# Patient Record
Sex: Female | Born: 1990 | Race: White | Hispanic: No | Marital: Single | Smoking: Former smoker
Health system: Southern US, Community
[De-identification: ages and names within clinical notes are randomized; demographics above are authoritative.]

## PROBLEM LIST (undated history)

## (undated) DIAGNOSIS — F1910 Other psychoactive substance abuse, uncomplicated: Secondary | ICD-10-CM

## (undated) DIAGNOSIS — R87619 Unspecified abnormal cytological findings in specimens from cervix uteri: Secondary | ICD-10-CM

## (undated) DIAGNOSIS — R51 Headache: Secondary | ICD-10-CM

## (undated) DIAGNOSIS — N912 Amenorrhea, unspecified: Secondary | ICD-10-CM

## (undated) DIAGNOSIS — S52601A Unspecified fracture of lower end of right ulna, initial encounter for closed fracture: Secondary | ICD-10-CM

## (undated) HISTORY — DX: Amenorrhea, unspecified: N91.2

## (undated) HISTORY — DX: Other psychoactive substance abuse, uncomplicated: F19.10

## (undated) HISTORY — PX: LEEP: SHX91

## (undated) HISTORY — PX: GUM SURGERY: SHX658

## (undated) HISTORY — DX: Unspecified abnormal cytological findings in specimens from cervix uteri: R87.619

## (undated) HISTORY — PX: COLPOSCOPY: SHX161

## (undated) HISTORY — DX: Headache: R51

## (undated) HISTORY — DX: Unspecified fracture of lower end of right ulna, initial encounter for closed fracture: S52.601A

---
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
## 2012-04-26 ENCOUNTER — Encounter: Payer: Self-pay | Admitting: Certified Nurse Midwife

## 2012-06-08 ENCOUNTER — Ambulatory Visit: Payer: Self-pay | Admitting: Certified Nurse Midwife

## 2012-06-08 ENCOUNTER — Encounter: Payer: Self-pay | Admitting: Certified Nurse Midwife

## 2012-06-08 DIAGNOSIS — Z01419 Encounter for gynecological examination (general) (routine) without abnormal findings: Secondary | ICD-10-CM

## 2012-06-09 ENCOUNTER — Telehealth: Payer: Self-pay

## 2012-06-09 NOTE — Telephone Encounter (Signed)
Left message for patient to call office. Pt needs to schedule aex for exam as well as thyroid f/u per deborah leonard,cnm

## 2012-06-15 NOTE — Telephone Encounter (Signed)
Left message for patient to schedule appt

## 2012-06-21 NOTE — Telephone Encounter (Signed)
Left message for call back.

## 2012-06-29 NOTE — Telephone Encounter (Signed)
Pt made appt for aex for 07-08-12

## 2012-07-08 ENCOUNTER — Ambulatory Visit (INDEPENDENT_AMBULATORY_CARE_PROVIDER_SITE_OTHER): Payer: BC Managed Care – PPO | Admitting: Gynecology

## 2012-07-08 ENCOUNTER — Encounter: Payer: Self-pay | Admitting: Certified Nurse Midwife

## 2012-07-08 VITALS — BP 104/68 | Ht 62.75 in | Wt 115.0 lb

## 2012-07-08 DIAGNOSIS — Z113 Encounter for screening for infections with a predominantly sexual mode of transmission: Secondary | ICD-10-CM

## 2012-07-08 DIAGNOSIS — F1911 Other psychoactive substance abuse, in remission: Secondary | ICD-10-CM

## 2012-07-08 DIAGNOSIS — A749 Chlamydial infection, unspecified: Secondary | ICD-10-CM

## 2012-07-08 DIAGNOSIS — F1910 Other psychoactive substance abuse, uncomplicated: Secondary | ICD-10-CM

## 2012-07-08 DIAGNOSIS — N912 Amenorrhea, unspecified: Secondary | ICD-10-CM

## 2012-07-08 DIAGNOSIS — Z01419 Encounter for gynecological examination (general) (routine) without abnormal findings: Secondary | ICD-10-CM

## 2012-07-08 NOTE — Progress Notes (Signed)
22 y.o.  Single  Caucasian female   No obstetric history on file. here for annual exam.  Pt contracepting with condoms only, no menses in April, inconsistant condom use, interested in birth control.  1st coitus at 37, 5 partners lifetime. No dyspareunia.   Patient's last menstrual period was 05/27/2012.          Sexually active: yes  The current method of family planning is condoms most of the time.    Exercising: none Last mammogram: none   Last pap smear: none History of abnormal pap: none  Smoking: 4 a day Alcohol: none Last colonoscopy: none Last Bone Density: none  Last tetanus shot: Last cholesterol check: none UPT: neg Self breast exams: occ Hgb:not done                Urine: not done    Health Maintenance  Topic Date Due  . Chlamydia Screening  03/03/2008  . Pap Smear  03/04/2011  . Tetanus/tdap  03/03/2012  . Influenza Vaccine  11/08/2012    Family History  Problem Relation Age of Onset  . Diabetes Paternal Grandmother     There are no active problems to display for this patient.   Past Medical History  Diagnosis Date  . Substance abuse     previous pain pill addiction  . Headache     Past Surgical History  Procedure Laterality Date  . Gum surgery      age 8    Allergies: Zithromax  Current Outpatient Prescriptions  Medication Sig Dispense Refill  . Ibuprofen (ADVIL PO) Take by mouth as needed.       No current facility-administered medications for this visit.    ROS: Pertinent items are noted in HPI.  Social Hx:    Exam:    BP 104/68  Ht 5' 2.75" (1.594 m)  Wt 115 lb (52.164 kg)  BMI 20.53 kg/m2  LMP 05/27/2012   Wt Readings from Last 3 Encounters:  07/08/12 115 lb (52.164 kg) (25%*, Z = -0.67)   * Growth percentiles are based on CDC 2-20 Years data.     Ht Readings from Last 3 Encounters:  07/08/12 5' 2.75" (1.594 m) (27%*, Z = -0.60)   * Growth percentiles are based on CDC 2-20 Years data.    General appearance: alert,  cooperative and appears stated age Head: Normocephalic, without obvious abnormality, atraumatic Neck: no adenopathy, supple, symmetrical, trachea midline and thyroid not enlarged, symmetric, no tenderness/mass/nodules Lungs: clear to auscultation bilaterally Breasts: Inspection negative, No nipple retraction or dimpling, No nipple discharge or bleeding, No axillary or supraclavicular adenopathy, Normal to palpation without dominant masses Heart: regular rate and rhythm Abdomen: soft, non-tender; bowel sounds normal; no masses,  no organomegaly Extremities: extremities normal, atraumatic, no cyanosis or edema Skin: Skin color, texture, turgor normal. No rashes or lesions Lymph nodes: Cervical, supraclavicular, and axillary nodes normal. No abnormal inguinal nodes palpated Neurologic: Grossly normal   Pelvic: External genitalia:  no lesions              Urethra:  normal appearing urethra with no masses, tenderness or lesions              Bartholins and Skenes: normal                 Vagina: normal appearing vagina with normal color and discharge, no lesions              Cervix: normal appearance  Pap taken: no        Bimanual Exam:  Uterus:  uterus is normal size, shape, consistency and nontender                                      Adnexa: normal adnexa in size, nontender and no masses                                      Rectovaginal: Confirms                                      Anus:  normal sphincter tone, no lesions  A: normal gyn exam     P: pap smear at 23, guideline reviewed STD prevention discussed Contraceptive options discussed-preferred nonoral options- info on SKYLA &NEXPLANON given, instructed to call with menses return annually or prn     An After Visit Summary was printed and given to the patient.

## 2012-07-08 NOTE — Patient Instructions (Addendum)
Contraceptive Implant Information A contraceptive implant is a plastic rod that is inserted under the skin. It is usually inserted under the skin of your upper arm. It continually releases small amounts of progestin (synthetic progesterone) into the bloodstream. This prevents an egg from being released from the ovary. It also thickens the cervical mucus to prevent sperm from entering the cervix, and it thins the uterine lining to prevent a fertilized egg from attaching to the uterus. They can be effective for up to 3 years. Implants do not provide protection against sexually transmitted diseases (STDs).  The procedure to insert an implant usually takes about 10 minutes. There may be minor bruising, swelling, and discomfort at the insertion site for a couple days. The implant begins to work within the first day. Other contraceptive protection should be used for 2 weeks. Follow up with your caregiver to get rechecked as directed. Your caregiver will make sure you are a good candidate for the contraceptive implant. Discuss with your caregiver the possible side effects of the implant ADVANTAGES  It prevents pregnancy for up to 3 years.  It is easily reversible.  It is convenient.  The progestins may protect against uterine and ovarian cancer.  It can be used when breastfeeding.  It can be used by women who cannot take estrogen. DISADVANTAGES  You may have irregular or unplanned vaginal bleeding.  You may develop side effects, including headache, weight gain, acne, breast tenderness, or mood changes.  You may have tissue or nerve damage after insertion (rare).  It may be difficult and uncomfortable to remove.  Certain medications may interfere with the effectiveness of the implants. REMOVAL OF IMPLANT The implant should be removed in 3 years or as directed by your caregiver. The implants effect wears off in a few hours after removal. Your ability to get pregnant (fertility) is restored  within a couple of weeks. New implants can be inserted as soon as the old ones are removed if desired. DO NOT GET THE IMPLANT IF:   You are pregnant.  You have a history of breast cancer, osteoporosis, blood clots, heart disease, diabetes, high blood pressure, liver disease, tumors, or stroke.   You have undiagnosed vaginal bleeding.  You have overly sensitive to certain parts of the implant. Document Released: 02/13/2011 Document Revised: 05/19/2011 Document Reviewed: 02/13/2011 [facility] Patient Information 2013 [place], [place].  Levonorgestrel intrauterine device (IUD) What is this medicine? LEVONORGESTREL IUD (LEE voe nor jes trel) is a contraceptive (birth control) device. It is used to prevent pregnancy and to treat heavy bleeding that occurs during your period. It can be used for up to 5 years. This medicine may be used for other purposes; ask your health care provider or pharmacist if you have questions. What should I tell my health care provider before I take this medicine? They need to know if you have any of these conditions: -abnormal Pap smear -cancer of the breast, uterus, or cervix -diabetes -endometritis -genital or pelvic infection now or in the past -have more than one sexual partner or your partner has more than one partner -heart disease -history of an ectopic or tubal pregnancy -immune system problems -IUD in place -liver disease or tumor -problems with blood clots or take blood-thinners -use intravenous drugs -uterus of unusual shape -vaginal bleeding that has not been explained -an unusual or allergic reaction to levonorgestrel, other hormones, silicone, or polyethylene, medicines, foods, dyes, or preservatives -pregnant or trying to get pregnant -breast-feeding How should I use this medicine?  This device is placed inside the uterus by a health care professional. Talk to your pediatrician regarding the use of this medicine in children. Special care may be  needed. Overdosage: If you think you have taken too much of this medicine contact a poison control center or emergency room at once. NOTE: This medicine is only for you. Do not share this medicine with others. What if I miss a dose? This does not apply. What may interact with this medicine? Do not take this medicine with any of the following medications: -amprenavir -bosentan -fosamprenavir This medicine may also interact with the following medications: -aprepitant -barbiturate medicines for inducing sleep or treating seizures -bexarotene -griseofulvin -medicines to treat seizures like carbamazepine, ethotoin, felbamate, oxcarbazepine, phenytoin, topiramate -modafinil -pioglitazone -rifabutin -rifampin -rifapentine -some medicines to treat HIV infection like atazanavir, indinavir, lopinavir, nelfinavir, tipranavir, ritonavir -St. John's wort -warfarin This list may not describe all possible interactions. Give your health care provider a list of all the medicines, herbs, non-prescription drugs, or dietary supplements you use. Also tell them if you smoke, drink alcohol, or use illegal drugs. Some items may interact with your medicine. What should I watch for while using this medicine? Visit your doctor or health care professional for regular check ups. See your doctor if you or your partner has sexual contact with others, becomes HIV positive, or gets a sexual transmitted disease. This product does not protect you against HIV infection (AIDS) or other sexually transmitted diseases. You can check the placement of the IUD yourself by reaching up to the top of your vagina with clean fingers to feel the threads. Do not pull on the threads. It is a good habit to check placement after each menstrual period. Call your doctor right away if you feel more of the IUD than just the threads or if you cannot feel the threads at all. The IUD may come out by itself. You may become pregnant if the device  comes out. If you notice that the IUD has come out use a backup birth control method like condoms and call your health care provider. Using tampons will not change the position of the IUD and are okay to use during your period. What side effects may I notice from receiving this medicine? Side effects that you should report to your doctor or health care professional as soon as possible: -allergic reactions like skin rash, itching or hives, swelling of the face, lips, or tongue -fever, flu-like symptoms -genital sores -high blood pressure -no menstrual period for 6 weeks during use -pain, swelling, warmth in the leg -pelvic pain or tenderness -severe or sudden headache -signs of pregnancy -stomach cramping -sudden shortness of breath -trouble with balance, talking, or walking -unusual vaginal bleeding, discharge -yellowing of the eyes or skin Side effects that usually do not require medical attention (report to your doctor or health care professional if they continue or are bothersome): -acne -breast pain -change in sex drive or performance -changes in weight -cramping, dizziness, or faintness while the device is being inserted -headache -irregular menstrual bleeding within first 3 to 6 months of use -nausea This list may not describe all possible side effects. Call your doctor for medical advice about side effects. You may report side effects to FDA at 1-800-FDA-1088. Where should I keep my medicine? This does not apply. NOTE: This sheet is a summary. It may not cover all possible information. If you have questions about this medicine, talk to your doctor, pharmacist, or health care provider.  2012, Elsevier/Gold Standard. (03/17/2008 6:39:08 PM)

## 2012-07-12 ENCOUNTER — Other Ambulatory Visit: Payer: Self-pay | Admitting: *Deleted

## 2012-07-12 MED ORDER — DOXYCYCLINE HYCLATE 100 MG PO CAPS: 100.0000 mg | ORAL_CAPSULE | Freq: Two times a day (BID) | ORAL | Status: DC

## 2012-07-12 NOTE — Addendum Note (Signed)
Addended by: Douglass Rivers on: 07/12/2012 10:32 AM   Modules accepted: Orders

## 2012-07-12 NOTE — Addendum Note (Signed)
Addended by: Douglass Rivers on: 07/12/2012 10:38 AM   Modules accepted: Orders

## 2012-07-12 NOTE — Telephone Encounter (Signed)
Patient notified of medication sent to [facility] and need to follow up in 3 month with Dr. Farrel Gobble.

## 2012-07-13 ENCOUNTER — Telehealth: Payer: Self-pay | Admitting: Gynecology

## 2012-07-13 NOTE — Telephone Encounter (Signed)
Patient took doxycycline (for Chlamydia) and cold medicine at the same time and reports she just threw up four time. Patient wants to speak with nurse please.

## 2012-07-13 NOTE — Telephone Encounter (Signed)
Spoke with pharmacist about replacing the doxycycline pt vomited this morning. Pt can buy one out of pocket for $12, or wait until towards the end of the 7 days of treatment and it can be run through her insurance for a cheaper price.

## 2012-07-13 NOTE — Telephone Encounter (Signed)
Spoke with pt about replacing pill. Instructed pt she can wait 5-6 days to pick up so that the pharmacy can go through her insurance, or she can pay $12 out of pocket and get it anytime. Pt verbalized understanding.

## 2012-07-13 NOTE — Telephone Encounter (Signed)
Spoke with pt who took her first doxycycline pill with cold medicine this morning. Pt started feeling sick and vomited. Instructed pt to take the doxycycline by itself with a meal next time to make it easier on her stomach. Instructed pt I would contact pharmacy to replace the vomited pill as she needs to take the full course of treatment. Pt agreeable.

## 2012-07-26 ENCOUNTER — Telehealth: Payer: Self-pay | Admitting: Gynecology

## 2012-07-26 NOTE — Telephone Encounter (Signed)
[facility] notified of need to dispense one capsule of doxycycline for complete patient 14 day regimen . Patient was sick the first day of taking medication and did not keep down the first capsule.

## 2012-07-26 NOTE — Telephone Encounter (Signed)
Routed to triage. See notes

## 2012-07-26 NOTE — Telephone Encounter (Signed)
Patient was states one tablet of doxycycline hyclate was to be called in and pharmacy does not have it. [facility] [address]

## 2012-07-27 ENCOUNTER — Telehealth: Payer: Self-pay | Admitting: Gynecology

## 2012-07-27 NOTE — Telephone Encounter (Signed)
pateint states she took doxycycline this morning and has been throwing up all day. Can not keep anything down. Asking does she need another pill called in. See phone note for 07/13/2012, patient also asking if she may have work excuse for today because she has been sick today. Please advise. sue

## 2012-07-27 NOTE — Telephone Encounter (Signed)
Patient called because she is continuously vomiting after having taken medication.

## 2012-07-27 NOTE — Telephone Encounter (Signed)
Patient regurgitated last pill and needs refill of that last pill.

## 2012-08-18 ENCOUNTER — Ambulatory Visit (INDEPENDENT_AMBULATORY_CARE_PROVIDER_SITE_OTHER): Payer: BC Managed Care – PPO | Admitting: Gynecology

## 2012-08-18 ENCOUNTER — Encounter: Payer: Self-pay | Admitting: Gynecology

## 2012-08-18 VITALS — BP 100/60 | Wt 116.0 lb

## 2012-08-18 DIAGNOSIS — Z8619 Personal history of other infectious and parasitic diseases: Secondary | ICD-10-CM | POA: Insufficient documentation

## 2012-08-18 DIAGNOSIS — Z202 Contact with and (suspected) exposure to infections with a predominantly sexual mode of transmission: Secondary | ICD-10-CM

## 2012-08-18 DIAGNOSIS — Z3040 Encounter for surveillance of contraceptives, unspecified: Secondary | ICD-10-CM

## 2012-08-18 DIAGNOSIS — N912 Amenorrhea, unspecified: Secondary | ICD-10-CM

## 2012-08-18 DIAGNOSIS — Z309 Encounter for contraceptive management, unspecified: Secondary | ICD-10-CM

## 2012-08-18 MED ORDER — MEDROXYPROGESTERONE ACETATE 150 MG/ML IM SUSP: 150.0000 mg | INTRAMUSCULAR | Status: DC

## 2012-08-18 MED ORDER — MEDROXYPROGESTERONE ACETATE 150 MG/ML IM SUSP
150.0000 mg | Freq: Once | INTRAMUSCULAR | Status: AC
  Administered 2012-08-18: 150 mg via INTRAMUSCULAR

## 2012-08-18 NOTE — Progress Notes (Signed)
Subjective:     Patient ID: Maria Chang, female   DOB: [birth year], 22 y.o.   MRN: [id number]  HPI Comments: Pt here for contraceptive management and requests TOC for +CTM.  Pt reports LMP 05/27/12, neg UPT here today, she has not been sexually active since +CTM diagnosis and reports taking doxycyline as directed.  Pt reports boyfriend has also been treated.  Pt reports long history of missed menses in past, can skip 4-51m at a time.  Pt is interested in trying DepoProvera, we had discussed other options in the past.    Review of Systems Per HPI    Objective:   Physical Exam  Constitutional: She appears well-developed and well-nourished.  Neurological: She is alert.  Pelvic: cervix normal in appearance, external genitalia normal, no adnexal masses or tenderness, no cervical motion tenderness, uterus normal size, shape, and consistency and vagina normal without discharge      Assessment:     Contraceptive management H/o CTM Amenorrhea-neg upt    Plan:     Discussed pros and cons of DepoProvera- aware she may ave irregular bleeding and then no cycle which will be protective against uterine cancer Discussed not ideal timing for test of cure but will run urine still repeat in 72m

## 2012-08-18 NOTE — Patient Instructions (Addendum)

## 2012-08-19 LAB — GC/CHLAMYDIA PROBE AMP, URINE
Chlamydia, Swab/Urine, PCR: NEGATIVE
GC Probe Amp, Urine: NEGATIVE

## 2012-10-13 ENCOUNTER — Ambulatory Visit: Payer: BC Managed Care – PPO | Admitting: Gynecology

## 2012-11-03 ENCOUNTER — Other Ambulatory Visit: Payer: BC Managed Care – PPO

## 2012-11-03 ENCOUNTER — Ambulatory Visit (INDEPENDENT_AMBULATORY_CARE_PROVIDER_SITE_OTHER): Payer: BC Managed Care – PPO | Admitting: *Deleted

## 2012-11-03 VITALS — BP 114/60 | HR 60 | Resp 12 | Wt 120.0 lb

## 2012-11-03 DIAGNOSIS — Z3040 Encounter for surveillance of contraceptives, unspecified: Secondary | ICD-10-CM

## 2012-11-03 MED ORDER — MEDROXYPROGESTERONE ACETATE 150 MG/ML IM SUSP
150.0000 mg | Freq: Once | INTRAMUSCULAR | Status: AC
  Administered 2012-11-03: 150 mg via INTRAMUSCULAR

## 2012-11-03 NOTE — Progress Notes (Signed)
Patient tolerated injection well is aware to come in for next Depo in 3 months.

## 2012-12-10 IMAGING — US US SOFT TISSUE HEAD/NECK
1 series · 14 of 25 positions shown · non-contrast
Comparison: None

CLINICAL DATA: Thyromegaly

THYROID ULTRASOUND
TECHNIQUE: Ultrasound examination of the thyroid gland and adjacent
soft tissues was performed.

[Series 1: us soft tissue head/neck · 0.09mm/px · 14 of 45 slices shown]
[im 1/45]
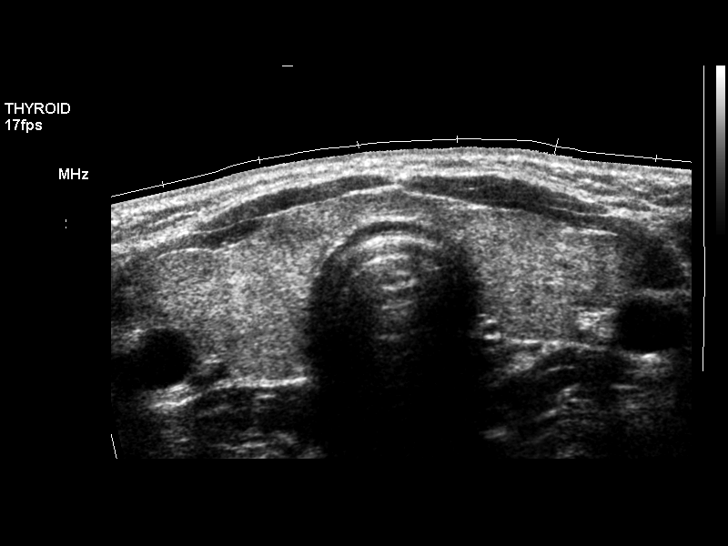
[im 4/45]
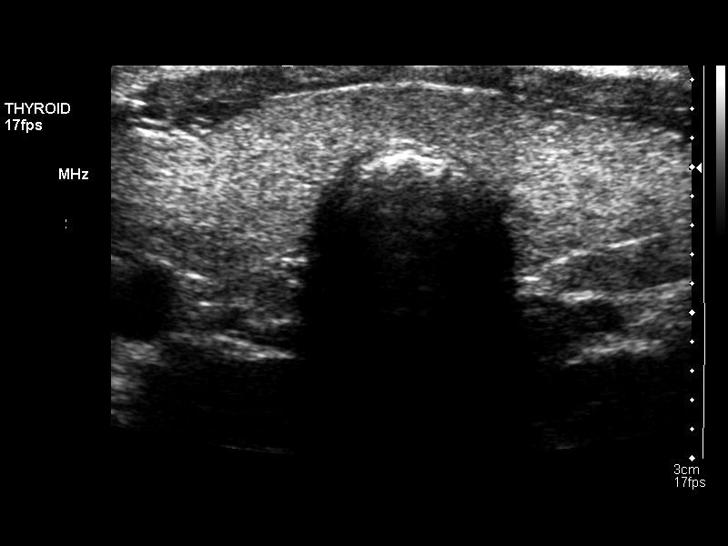
[im 8/45]
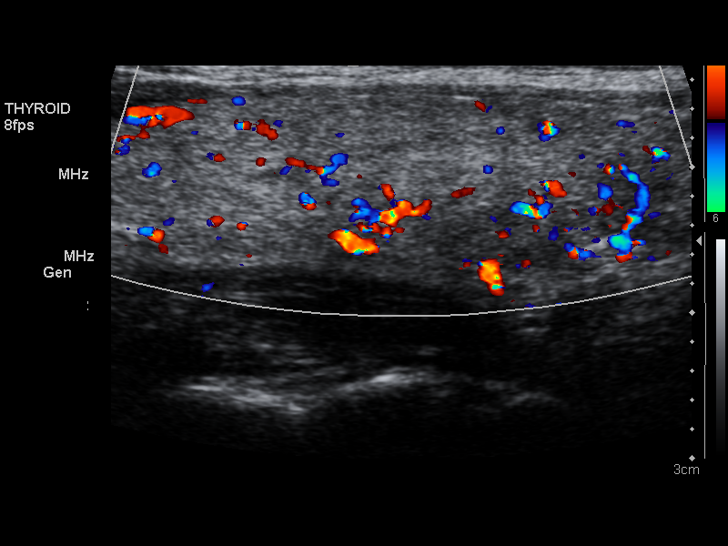
[im 12/45]
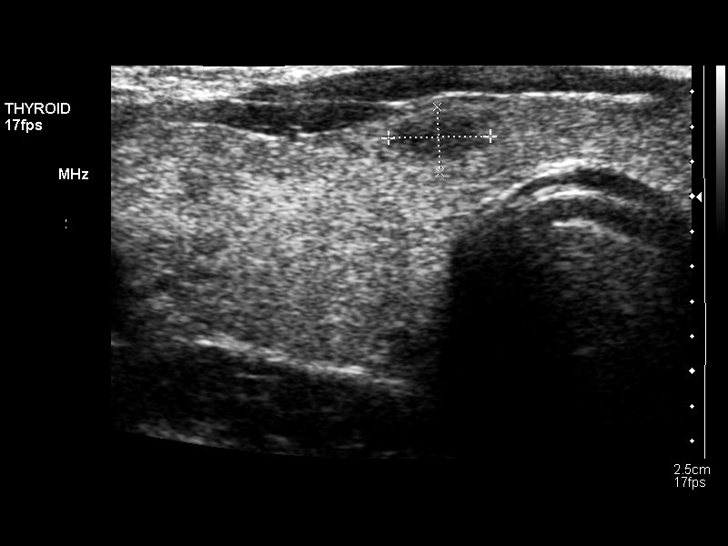
[im 15/45]
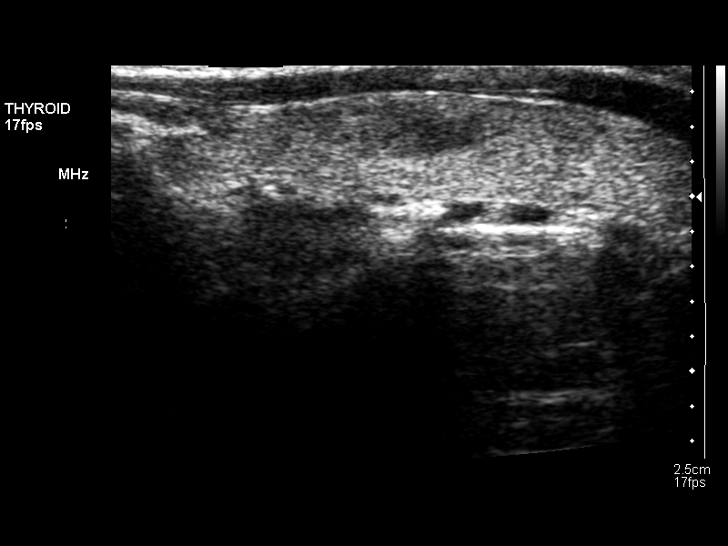
[im 17/45]
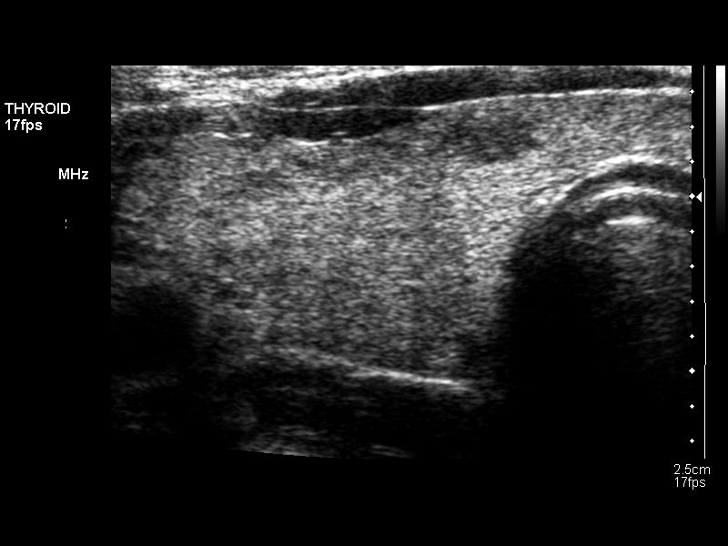
[im 21/45]
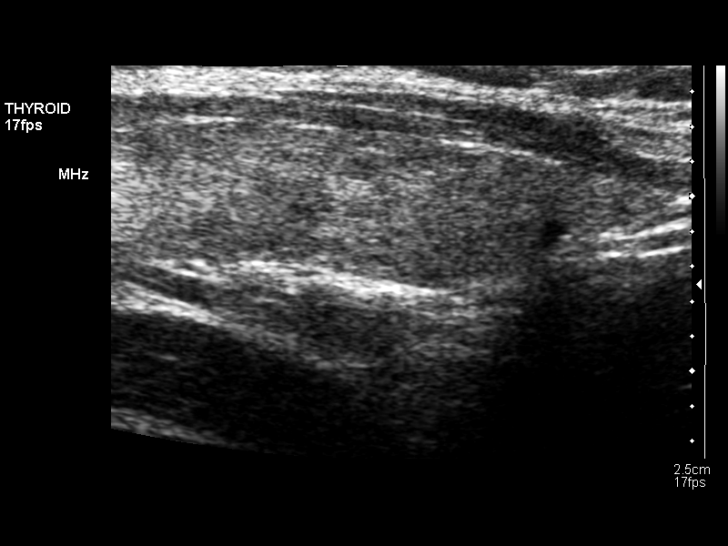
[im 24/45]
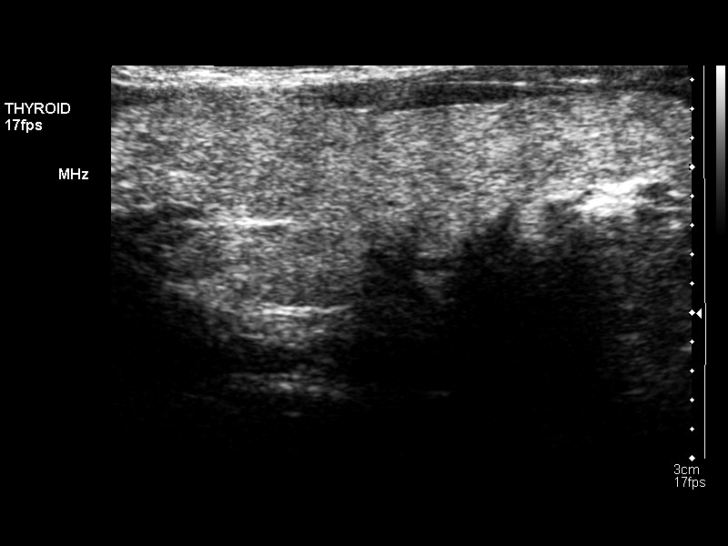
[im 28/45]
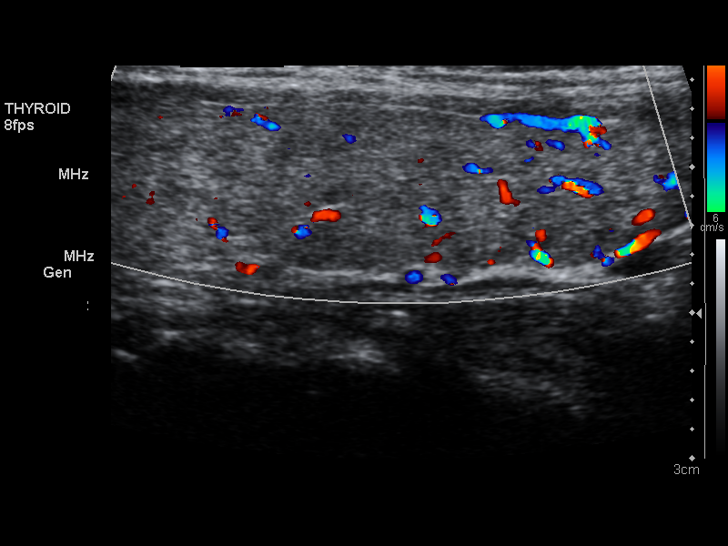
[im 30/45]
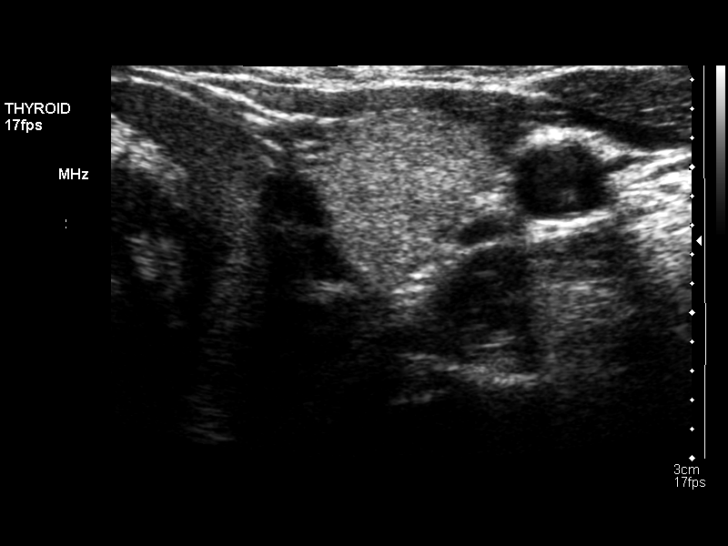
[im 34/45]
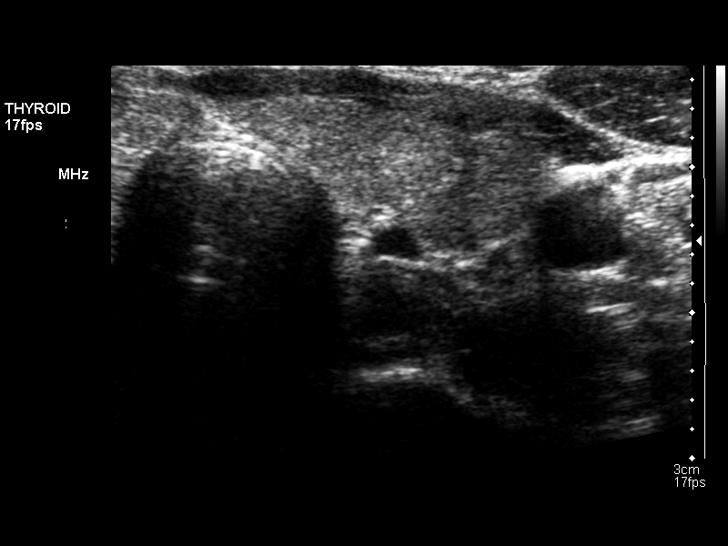
[im 37/45]
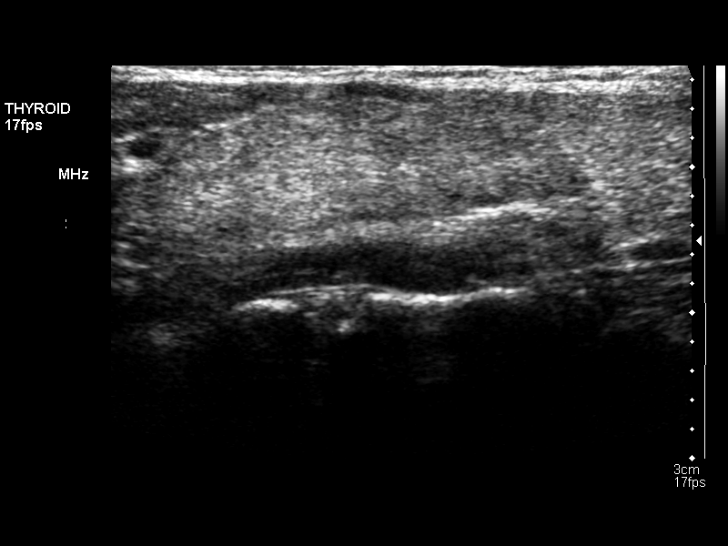
[im 41/45]
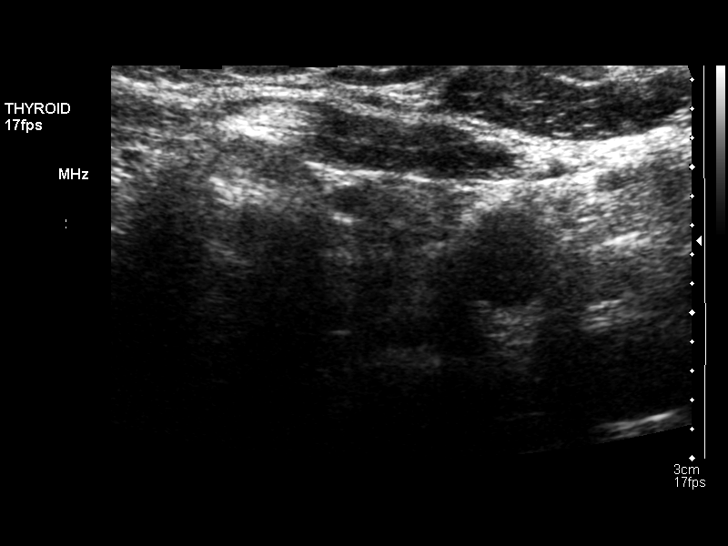
[im 45/45]
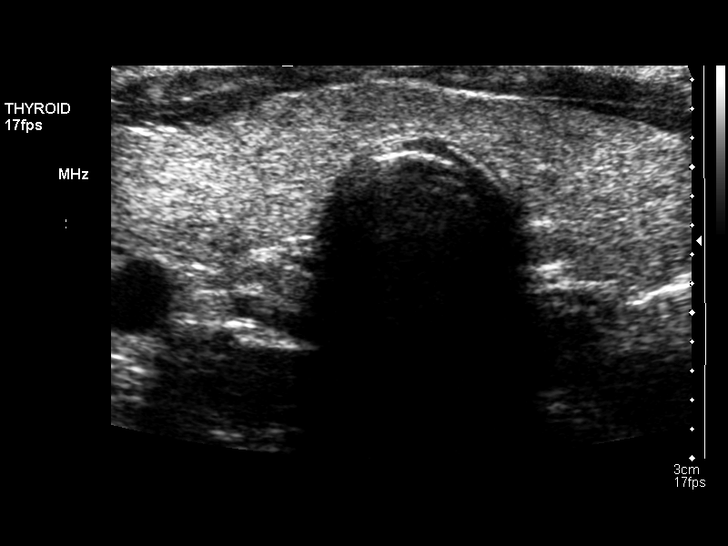

[14 of 25 positions shown; findings below may reference images not displayed]

FINDINGS: Right thyroid lobe:  15 x 19 x 16 mm, mildly inhomogeneous
Left thyroid lobe:  13 x 19 x 57 mm
Isthmus:  4.4 mm in thickness

Focal nodules:  4 x 6 x 7 mm hypoechoic, right lobe at isthmus.

Lymphadenopathy:  None visualized.
IMPRESSION: 1.  Single sub centimeter right thyroid nodule.  Findings do not
meet current consensus criteria for biopsy.  Follow-up by clinical
exam is recommended.  If patient has known risk factors for thyroid
carcinoma, consider follow-up ultrasound in 12 months.  If patient
is clinically hyperthyroid, consider nuclear medicine thyroid
uptake and scan. This recommendation follows the consensus
statement:  Management of Thyroid Nodules Detected as US:  Society
of Radiologists in Ultrasound Consensus Conference Statement.

## 2012-12-21 ENCOUNTER — Encounter: Payer: Self-pay | Admitting: Gynecology

## 2013-01-19 ENCOUNTER — Ambulatory Visit (INDEPENDENT_AMBULATORY_CARE_PROVIDER_SITE_OTHER): Payer: BC Managed Care – PPO | Admitting: *Deleted

## 2013-01-19 VITALS — BP 100/60 | HR 82 | Resp 18 | Wt 121.0 lb

## 2013-01-19 DIAGNOSIS — Z3040 Encounter for surveillance of contraceptives, unspecified: Secondary | ICD-10-CM

## 2013-01-19 MED ORDER — MEDROXYPROGESTERONE ACETATE 150 MG/ML IM SUSP
150.0000 mg | Freq: Once | INTRAMUSCULAR | Status: AC
  Administered 2013-01-19: 150 mg via INTRAMUSCULAR

## 2013-01-19 NOTE — Addendum Note (Signed)
Addended by: Ernest Haber on: 01/19/2013 02:52 PM   Modules accepted: Level of Service

## 2013-04-06 ENCOUNTER — Ambulatory Visit: Payer: BC Managed Care – PPO

## 2013-04-06 ENCOUNTER — Ambulatory Visit (INDEPENDENT_AMBULATORY_CARE_PROVIDER_SITE_OTHER): Payer: BC Managed Care – PPO | Admitting: *Deleted

## 2013-04-06 VITALS — BP 116/64 | HR 78 | Resp 16 | Wt 123.0 lb

## 2013-04-06 DIAGNOSIS — Z3040 Encounter for surveillance of contraceptives, unspecified: Secondary | ICD-10-CM

## 2013-04-06 MED ORDER — MEDROXYPROGESTERONE ACETATE 150 MG/ML IM SUSP
150.0000 mg | Freq: Once | INTRAMUSCULAR | Status: AC
  Administered 2013-04-06: 150 mg via INTRAMUSCULAR

## 2013-04-06 NOTE — Progress Notes (Signed)
Next Depo Provera Due 4/15-4/29/15

## 2013-06-22 ENCOUNTER — Ambulatory Visit (INDEPENDENT_AMBULATORY_CARE_PROVIDER_SITE_OTHER): Payer: BC Managed Care – PPO

## 2013-06-22 VITALS — BP 120/70 | HR 68 | Resp 16 | Ht 62.75 in | Wt 127.0 lb

## 2013-06-22 DIAGNOSIS — Z3040 Encounter for surveillance of contraceptives, unspecified: Secondary | ICD-10-CM

## 2013-06-22 DIAGNOSIS — Z309 Encounter for contraceptive management, unspecified: Secondary | ICD-10-CM

## 2013-06-22 MED ORDER — MEDROXYPROGESTERONE ACETATE 150 MG/ML IM SUSP
150.0000 mg | Freq: Once | INTRAMUSCULAR | Status: AC
  Administered 2013-06-22: 150 mg via INTRAMUSCULAR

## 2013-06-22 NOTE — Progress Notes (Signed)
Pt here for depo provera 150mg . Last aex was 07-08-12. Last depo was given 04-06-13. Pt aware she needs aex before next depo can be given. Pt has aex scheduled. Next depo due between 09-07-13 thru 09-21-13. Pt tolerated injection well

## 2013-07-21 ENCOUNTER — Ambulatory Visit: Payer: BC Managed Care – PPO | Admitting: Gynecology

## 2013-07-22 ENCOUNTER — Ambulatory Visit: Payer: BC Managed Care – PPO | Admitting: Gynecology

## 2013-08-03 ENCOUNTER — Encounter: Payer: Self-pay | Admitting: Gynecology

## 2013-08-03 ENCOUNTER — Ambulatory Visit (INDEPENDENT_AMBULATORY_CARE_PROVIDER_SITE_OTHER): Payer: BC Managed Care – PPO | Admitting: Gynecology

## 2013-08-03 VITALS — BP 100/60 | HR 68 | Resp 16 | Ht 62.75 in | Wt 126.0 lb

## 2013-08-03 DIAGNOSIS — Z01419 Encounter for gynecological examination (general) (routine) without abnormal findings: Secondary | ICD-10-CM

## 2013-08-03 NOTE — Progress Notes (Signed)
23 y.o. single Caucasian female   [id number] here for annual exam. Pt is currently sexually active.  She reports not using condoms on a regular basis.  First sexual activity at 23 years old, 4 number of lifetime partners.  Same partner as last year.  Pt reports no cycle on DepProvera.  No issues   Patient's last menstrual period was 06/08/2013.          Sexually active: yes  The current method of family planning is Depo-Provera injections.    Exercising: no  exercise Last pap: no pap ever Alcohol:none Tobacco:no Drugs:marijuana Gardisil: no  SBE: done occ   Health Maintenance  Topic Date Due  . Chlamydia Screening  03/03/2008  . Pap Smear  03/04/2011  . Tetanus/tdap  03/03/2012  . Influenza Vaccine  10/08/2013    Family History  Problem Relation Age of Onset  . Diabetes Paternal Grandmother   . Glaucoma Paternal Grandmother   . Aneurysm Mother     Patient Active Problem List   Diagnosis Date Noted  . History of chlamydia infection 08/18/2012  . History of substance abuse 07/08/2012    Past Medical History  Diagnosis Date  . Substance abuse     previous pain pill addiction  . Headache(784.0)   . Amenorrhea     Past Surgical History  Procedure Laterality Date  . Gum surgery      age 89    Allergies: Zithromax  Current Outpatient Prescriptions  Medication Sig Dispense Refill  . Ibuprofen (ADVIL PO) Take by mouth as needed.      . medroxyPROGESTERone (DEPO-PROVERA) 150 MG/ML injection Inject 1 mL (150 mg total) into the muscle every 3 (three) months.  1 mL  3   No current facility-administered medications for this visit.    ROS: Pertinent items are noted in HPI.  Exam:    BP 100/60  Pulse 68  Resp 16  Ht 5' 2.75" (1.594 m)  Wt 126 lb (57.153 kg)  BMI 22.49 kg/m2  LMP 06/08/2013 Weight change: @WEIGHTCHANGE @ Last 3 height recordings:  Ht Readings from Last 3 Encounters:  08/03/13 5' 2.75" (1.594 m)  06/22/13 5' 2.75" (1.594 m)  07/08/12 5' 2.75"  (1.594 m) (27%*, Z = -0.60)   * Growth percentiles are based on CDC 2-20 Years data.   General appearance: alert, cooperative and appears stated age Head: Normocephalic, without obvious abnormality, atraumatic Neck: no adenopathy, no carotid bruit, no JVD, supple, symmetrical, trachea midline and thyroid not enlarged, symmetric, no tenderness/mass/nodules Lungs: clear to auscultation bilaterally Breasts: normal appearance, no masses or tenderness Heart: regular rate and rhythm, S1, S2 normal, no murmur, click, rub or gallop Abdomen: soft, non-tender; bowel sounds normal; no masses,  no organomegaly Extremities: extremities normal, atraumatic, no cyanosis or edema Skin: Skin color, texture, turgor normal. No rashes or lesions Lymph nodes: Cervical, supraclavicular, and axillary nodes normal. no inguinal nodes palpated Neurologic: Grossly normal   Pelvic: External genitalia:  normal escutcheon              Urethra: normal appearing urethra with no masses, tenderness or lesions              Bartholins and Skenes: Bartholin's, Urethra, Skene's normal                 Vagina: normal appearing vagina with normal color and discharge, no lesions              Cervix: normal appearance  Pap taken: no        Bimanual Exam:  Uterus:  uterus is normal size, shape, consistency and nontender                                      Adnexa:    normal adnexa in size, nontender and no masses                                      Rectovaginal: Confirms                                      Anus:  normal sphincter tone, no lesions  A: well woman no contraindication to continue use of oral contraceptives Contraceptive management     P: pap smear next year counseled on breast self exam, STD prevention, adequate intake of calcium and vitamin D, diet and exercise return annually or prn Discussed STD prevention, regular condom use.Marland Kitchen. Pt will start gardisil with next depo shot     An After Visit  Summary was printed and given to the patient.

## 2013-09-07 ENCOUNTER — Ambulatory Visit: Payer: BC Managed Care – PPO | Admitting: Gynecology

## 2013-09-07 VITALS — BP 102/60 | HR 68 | Ht 62.75 in | Wt 127.0 lb

## 2013-09-07 DIAGNOSIS — Z3040 Encounter for surveillance of contraceptives, unspecified: Secondary | ICD-10-CM

## 2013-09-07 MED ORDER — MEDROXYPROGESTERONE ACETATE 150 MG/ML IM SUSP
150.0000 mg | Freq: Once | INTRAMUSCULAR | Status: AC
  Administered 2013-09-07: 150 mg via INTRAMUSCULAR

## 2013-09-07 NOTE — Progress Notes (Signed)
Pt here for Depo Provera 150mg  Injection. Last AEX 08/03/13. Last Depo given was 06/22/13. Next Depo due between 11/23/13-12/07/13. Pt tolerated injection well

## 2013-11-23 ENCOUNTER — Ambulatory Visit: Payer: BC Managed Care – PPO

## 2013-11-23 ENCOUNTER — Ambulatory Visit (INDEPENDENT_AMBULATORY_CARE_PROVIDER_SITE_OTHER): Payer: BC Managed Care – PPO | Admitting: *Deleted

## 2013-11-23 VITALS — BP 106/60 | HR 80 | Resp 16 | Ht 62.75 in | Wt 133.0 lb

## 2013-11-23 DIAGNOSIS — Z3040 Encounter for surveillance of contraceptives, unspecified: Secondary | ICD-10-CM

## 2013-11-23 MED ORDER — MEDROXYPROGESTERONE ACETATE 150 MG/ML IM SUSP
150.0000 mg | Freq: Once | INTRAMUSCULAR | Status: AC
  Administered 2013-11-23: 150 mg via INTRAMUSCULAR

## 2013-11-23 NOTE — Progress Notes (Signed)
Patient in today for Depo provera shot.  Last Depo 09/07/13. Last AEX 08/03/13 Patient is on time for Depo today and denies any problems with shots in the past. Patient tolerated shot well. Inform pt Next Depo Due 12/2-12/16/15. -pt agreed.

## 2014-02-08 ENCOUNTER — Telehealth: Payer: Self-pay | Admitting: Gynecology

## 2014-02-08 ENCOUNTER — Ambulatory Visit (INDEPENDENT_AMBULATORY_CARE_PROVIDER_SITE_OTHER): Payer: BC Managed Care – PPO | Admitting: *Deleted

## 2014-02-08 ENCOUNTER — Ambulatory Visit: Payer: BC Managed Care – PPO

## 2014-02-08 VITALS — BP 106/70 | HR 82 | Resp 18 | Ht 62.75 in | Wt 139.6 lb

## 2014-02-08 DIAGNOSIS — Z3040 Encounter for surveillance of contraceptives, unspecified: Secondary | ICD-10-CM

## 2014-02-08 MED ORDER — MEDROXYPROGESTERONE ACETATE 150 MG/ML IM SUSP
150.0000 mg | Freq: Once | INTRAMUSCULAR | Status: AC
  Administered 2014-02-08: 150 mg via INTRAMUSCULAR

## 2014-02-08 NOTE — Telephone Encounter (Signed)
Left message regarding upcoming appointment has been canceled and needs to be rescheduled. °

## 2014-02-08 NOTE — Progress Notes (Signed)
Patient is within Depo Provera Calender Limits due between 12/2-12/16/15 Next Depo Due between:  2/17-05/11/14  Last AEX: 08/03/13 with Dr. Kris HartmannLathrop AEX Scheduled: 08/08/14 with Ms. Debbie  Patient is aware  Pt tolerated Injection well in Right Upper Outer Quadrant   Routed to provider for review, encounter closed.

## 2014-04-26 ENCOUNTER — Ambulatory Visit (INDEPENDENT_AMBULATORY_CARE_PROVIDER_SITE_OTHER): Payer: BLUE CROSS/BLUE SHIELD | Admitting: *Deleted

## 2014-04-26 ENCOUNTER — Ambulatory Visit: Payer: BC Managed Care – PPO

## 2014-04-26 VITALS — BP 108/70 | HR 80 | Resp 20 | Ht 62.75 in | Wt 147.0 lb

## 2014-04-26 DIAGNOSIS — Z3040 Encounter for surveillance of contraceptives, unspecified: Secondary | ICD-10-CM

## 2014-04-26 MED ORDER — MEDROXYPROGESTERONE ACETATE 150 MG/ML IM SUSP
150.0000 mg | Freq: Once | INTRAMUSCULAR | Status: AC
  Administered 2014-04-26: 150 mg via INTRAMUSCULAR

## 2014-04-26 NOTE — Progress Notes (Signed)
Patient in today for Depo Provera Injection.   Last AEX 08/03/13  Next AEX scheduled 08/08/14 w/ DL Last Depo 10/14/5710/2/15 Pt is within due dates today.  Patient tolerated shot well. Advised patient to keep AEX appt and next Depo due 5/5- 5/19. - Pt verbalized understanding.

## 2014-05-04 ENCOUNTER — Encounter (HOSPITAL_COMMUNITY): Payer: Self-pay | Admitting: Emergency Medicine

## 2014-05-04 ENCOUNTER — Emergency Department (HOSPITAL_COMMUNITY)
Admission: EM | Admit: 2014-05-04 | Discharge: 2014-05-05 | Disposition: A | Discharge status: Home / Self Care | Payer: BLUE CROSS/BLUE SHIELD | Attending: Emergency Medicine | Admitting: Emergency Medicine

## 2014-05-04 DIAGNOSIS — Y998 Other external cause status: Secondary | ICD-10-CM | POA: Insufficient documentation

## 2014-05-04 DIAGNOSIS — W540XXA Bitten by dog, initial encounter: Secondary | ICD-10-CM | POA: Insufficient documentation

## 2014-05-04 DIAGNOSIS — Z8742 Personal history of other diseases of the female genital tract: Secondary | ICD-10-CM | POA: Diagnosis not present

## 2014-05-04 DIAGNOSIS — S60414A Abrasion of right ring finger, initial encounter: Secondary | ICD-10-CM | POA: Diagnosis not present

## 2014-05-04 DIAGNOSIS — Z23 Encounter for immunization: Secondary | ICD-10-CM | POA: Diagnosis not present

## 2014-05-04 DIAGNOSIS — Z87891 Personal history of nicotine dependence: Secondary | ICD-10-CM | POA: Insufficient documentation

## 2014-05-04 DIAGNOSIS — S51812A Laceration without foreign body of left forearm, initial encounter: Secondary | ICD-10-CM | POA: Insufficient documentation

## 2014-05-04 DIAGNOSIS — Y9289 Other specified places as the place of occurrence of the external cause: Secondary | ICD-10-CM | POA: Diagnosis not present

## 2014-05-04 DIAGNOSIS — Y9389 Activity, other specified: Secondary | ICD-10-CM | POA: Diagnosis not present

## 2014-05-04 DIAGNOSIS — S59912A Unspecified injury of left forearm, initial encounter: Secondary | ICD-10-CM | POA: Diagnosis present

## 2014-05-04 MED ORDER — LIDOCAINE-EPINEPHRINE (PF) 2 %-1:200000 IJ SOLN
10.0000 mL | Freq: Once | INTRAMUSCULAR | Status: AC
  Administered 2014-05-05: 10 mL
  Filled 2014-05-04: qty 20

## 2014-05-04 MED ORDER — TETANUS-DIPHTH-ACELL PERTUSSIS 5-2.5-18.5 LF-MCG/0.5 IM SUSP
0.5000 mL | Freq: Once | INTRAMUSCULAR | Status: AC
  Administered 2014-05-04: 0.5 mL via INTRAMUSCULAR
  Filled 2014-05-04: qty 0.5

## 2014-05-04 NOTE — ED Provider Notes (Signed)
CSN: [id number]     Arrival date & time 05/04/14  2257 History  This chart was scribed for non-physician practitioner, Antony Madura, PA-C, working with Hanley Seamen, MD, by Roxy Cedar ED Scribe. This patient was seen in room WTR1/WLPT1 and the patient's care was started at 11:07 PM    Chief Complaint  Patient presents with  . Animal Bite   Patient is a 24 y.o. female presenting with animal bite. The history is provided by the patient. No language interpreter was used.  Animal Bite  HPI Comments: Maria Chang is a 24 y.o. female who presents to the Emergency Department complaining of sudden onset of left forearm pain due to multiple lacerations from a dog bite which occurred 1 hour ago. Patient states that her boyfriend's dog had not eaten for a long time, and began acting up when patient was leaning over near dog's food bowl. Patient reports dog received vaccinations as a puppy; none since then. Patient is not sure of when her last tetanus booster was given. She denies extremity numbness, weakness, and pallor.  Past Medical History  Diagnosis Date  . Substance abuse     previous pain pill addiction  . Headache(784.0)   . Amenorrhea    Past Surgical History  Procedure Laterality Date  . Gum surgery      age 47   Family History  Problem Relation Age of Onset  . Diabetes Paternal Grandmother   . Glaucoma Paternal Grandmother   . Aneurysm Mother    History  Substance Use Topics  . Smoking status: Former Smoker    Types: Cigarettes  . Smokeless tobacco: Not on file     Comment: 4  day  . Alcohol Use: No   OB History    Gravida Para Term Preterm AB TAB SAB Ectopic Multiple Living        Review of Systems  Skin: Positive for wound.  All other systems reviewed and are negative.   Allergies  Zithromax  Home Medications   Prior to Admission medications   Medication Sig Start Date End Date Taking? Authorizing Provider  amoxicillin-clavulanate  (AUGMENTIN) 875-125 MG per tablet Take 1 tablet by mouth every 12 (twelve) hours. 05/05/14   Antony Madura, PA-C  ibuprofen (ADVIL,MOTRIN) 600 MG tablet Take 1 tablet (600 mg total) by mouth every 6 (six) hours as needed. 05/05/14   Antony Madura, PA-C  medroxyPROGESTERone (DEPO-PROVERA) 150 MG/ML injection Inject 1 mL (150 mg total) into the muscle every 3 (three) months. 08/18/12   Douglass Rivers, MD   BP 127/81 mmHg  Pulse 72  Temp(Src) 98.3 F (36.8 C) (Oral)  Resp 20  SpO2 98%   Physical Exam  Constitutional: She is oriented to person, place, and time. She appears well-developed and well-nourished. No distress.  Nontoxic/nonseptic appearing  HENT:  Head: Normocephalic and atraumatic.  Eyes: Conjunctivae and EOM are normal. No scleral icterus.  Neck: Normal range of motion.  Cardiovascular: Normal rate, regular rhythm and intact distal pulses.   Distal radial pulse 2+ in the left upper extremity  Pulmonary/Chest: Effort normal. No respiratory distress.  Musculoskeletal: Normal range of motion. She exhibits tenderness.       Left forearm: She exhibits tenderness and laceration (x3). She exhibits no bony tenderness, no swelling, no edema and no deformity.       Arms: Abrasion on the lateral aspect of the right 4th finger. Laceraton to the left dorsal  forearm 1.5cm; gaping. Laceration of 1cm noted to be proximal to 1.5cm laceration. There is also a laceration on the medial aspect of the left forearm which is 1cm; also gaping. Normal ROM of LUE. No FBs visualized in wounds.  Neurological: She is alert and oriented to person, place, and time. She exhibits normal muscle tone. Coordination normal.  Sensation to light touch intact. Normal grip strength in the left upper extremity.  Skin: Skin is warm and dry. No rash noted. She is not diaphoretic. No erythema. No pallor.  Psychiatric: She has a normal mood and affect. Her behavior is normal.  Nursing note and vitals reviewed.  ED Course   Procedures (including critical care time)  DIAGNOSTIC STUDIES:    COORDINATION OF CARE: 11:11 PM- Discussed plans to suture affected wound. Will give patient Tdap injection. Pt advised of plan for treatment and pt agrees.  Labs Review Labs Reviewed - No data to display  Imaging Review No results found.   EKG Interpretation None       LACERATION REPAIR Performed by: Antony MaduraHUMES, Dee Maday Authorized by: Antony MaduraHUMES, Ghadeer Kastelic Consent: Verbal consent obtained. Risks and benefits: risks, benefits and alternatives were discussed Consent given by: patient Patient identity confirmed: provided demographic data Prepped and Draped in normal sterile fashion Wound explored  Laceration Location: L foearm, lateral  Laceration Length: 1cm  No Foreign Bodies seen or palpated  Anesthesia: local infiltration  Local anesthetic: lidocaine 2% with epinephrine  Anesthetic total: 3 ml  Irrigation method: syringe Amount of cleaning: standard  Skin closure: 4-0 ethilon  Number of sutures: 1 loosely approximated suture  Technique: simple interrupted  Patient tolerance: Patient tolerated the procedure well with no immediate complications.   LACERATION REPAIR Performed by: Antony MaduraHUMES, Gayathri Futrell Authorized by: Antony MaduraHUMES, Carmon Brigandi Consent: Verbal consent obtained. Risks and benefits: risks, benefits and alternatives were discussed Consent given by: patient Patient identity confirmed: provided demographic data Prepped and Draped in normal sterile fashion Wound explored  Laceration Location: L forearm, dorsal  Laceration Length: 1.5cm  No Foreign Bodies seen or palpated  Anesthesia: local infiltration  Local anesthetic: lidocaine 2% with epinephrine  Anesthetic total: 5 ml  Irrigation method: syringe Amount of cleaning: standard  Skin closure: 4-0 ethilon  Number of sutures: 2 loosely approximated sutures  Technique: simple interrupted  Patient tolerance: Patient tolerated the procedure well with  no immediate complications.   MDM   Final diagnoses:  Forearm laceration, left, initial encounter  Dog bite    24 year old female presents to the emergency department for further evaluation of lacerations sustained from a dog bite. Patient is neurovascularly intact. She has 2 gaping lacerations as well as a third laceration all noted to her left forearm. Tetanus updated in ED and patient given first dose of Augmentin. Have also discussed the risks of closure with the patient including increased risk of infection. Patient opts to proceed with loose approximation of wounds after being explained all of the risks involved with any type of closure in instances of animal bites. Edges brought together but wounds, ultimately, left open. Wounds irrigated with copious amounts of water prior to being loosely approximated. Wounds dressed appropriately in ED. Patient to return in one week for wound recheck and suture removal. Return precautions discussed and provided. Patient agreeable to plan with no unaddressed concerns.  I personally performed the services described in this documentation, which was scribed in my presence. The recorded information has been reviewed and is accurate.   Filed Vitals:   05/04/14 2314  BP:  127/81  Pulse: 72  Temp: 98.3 F (36.8 C)  TempSrc: Oral  Resp: 20  SpO2: 98%     Antony Madura, PA-C 05/05/14 0021  Hanley Seamen, MD 05/05/14 [phone]

## 2014-05-04 NOTE — ED Notes (Signed)
Pt states she was bit by her boyfriends dog  Pt states she was feeding it and her dog got near its dish and it got upset and went after her dog and bit her instead  Pt has puncture wounds noted to her left forearm with redness noted  Pt also has an abrasion noted to her right ring finger  Pt states she is unsure if the dog is up to date on its shots but will call her boyfriend to find out

## 2014-05-04 NOTE — ED Notes (Signed)
Pt states boyfriend states the dog had its shots when it was a puppy but not since then  Dog is 22 years old now

## 2014-05-05 MED ORDER — IBUPROFEN 600 MG PO TABS: 600.0000 mg | ORAL_TABLET | Freq: Four times a day (QID) | ORAL | Status: DC | PRN

## 2014-05-05 MED ORDER — AMOXICILLIN-POT CLAVULANATE 875-125 MG PO TABS: 1.0000 | ORAL_TABLET | Freq: Two times a day (BID) | ORAL | Status: DC

## 2014-05-05 MED ORDER — BACITRACIN 500 UNIT/GM EX OINT
1.0000 "application " | TOPICAL_OINTMENT | Freq: Two times a day (BID) | CUTANEOUS | Status: DC
  Administered 2014-05-05: 1 via TOPICAL
  Filled 2014-05-05: qty 1.8
  Filled 2014-05-05: qty 0.9

## 2014-05-05 MED ORDER — AMOXICILLIN-POT CLAVULANATE 875-125 MG PO TABS
1.0000 | ORAL_TABLET | Freq: Once | ORAL | Status: AC
  Administered 2014-05-05: 1 via ORAL
  Filled 2014-05-05: qty 1

## 2014-05-05 NOTE — Discharge Instructions (Signed)
Animal Bite °An animal bite can result in a scratch on the skin, deep open cut, puncture of the skin, crush injury, or tearing away of the skin or a body part. Dogs are responsible for most animal bites. Children are bitten more often than adults. An animal bite can range from very mild to more serious. A small bite from your house pet is no cause for alarm. However, some animal bites can become infected or injure a bone or other tissue. You must seek medical care if: °· The skin is broken and bleeding does not slow down or stop after 15 minutes. °· The puncture is deep and difficult to clean (such as a cat bite). °· Pain, warmth, redness, or pus develops around the wound. °· The bite is from a stray animal or rodent. There may be a risk of rabies infection. °· The bite is from a snake, raccoon, skunk, fox, coyote, or bat. There may be a risk of rabies infection. °· The person bitten has a chronic illness such as diabetes, liver disease, or cancer, or the person takes medicine that lowers the immune system. °· There is concern about the location and severity of the bite. °It is important to clean and protect an animal bite wound right away to prevent infection. Follow these steps: °· Clean the wound with plenty of water and soap. °· Apply an antibiotic cream. °· Apply gentle pressure over the wound with a clean towel or gauze to slow or stop bleeding. °· Elevate the affected area above the heart to help stop any bleeding. °· Seek medical care. Getting medical care within 8 hours of the animal bite leads to the best possible outcome. °DIAGNOSIS  °Your caregiver will most likely: °· Take a detailed history of the animal and the bite injury. °· Perform a wound exam. °· Take your medical history. °Blood tests or X-rays may be performed. Sometimes, infected bite wounds are cultured and sent to a lab to identify the infectious bacteria.  °TREATMENT  °Medical treatment will depend on the location and type of animal bite as  well as the patient's medical history. Treatment may include: °· Wound care, such as cleaning and flushing the wound with saline solution, bandaging, and elevating the affected area. °· Antibiotics. °· Tetanus immunization. °· Rabies immunization. °· Leaving the wound open to heal. This is often done with animal bites, due to the high risk of infection. However, in certain cases, wound closure with stitches, wound adhesive, skin adhesive strips, or staples may be used. ° Infected bites that are left untreated may require intravenous (IV) antibiotics and surgical treatment in the hospital. °HOME CARE INSTRUCTIONS °· Follow your caregiver's instructions for wound care. °· Take all medicines as directed. °· If your caregiver prescribes antibiotics, take them as directed. Finish them even if you start to feel better. °· Follow up with your caregiver for further exams or immunizations as directed. °You may need a tetanus shot if: °· You cannot remember when you had your last tetanus shot. °· You have never had a tetanus shot. °· The injury broke your skin. °If you get a tetanus shot, your arm may swell, get red, and feel warm to the touch. This is common and not a problem. If you need a tetanus shot and you choose not to have one, there is a rare chance of getting tetanus. Sickness from tetanus can be serious. °SEEK MEDICAL CARE IF: °· You notice warmth, redness, soreness, swelling, pus discharge, or a bad   smell coming from the wound. °· You have a red line on the skin coming from the wound. °· You have a fever, chills, or a general ill feeling. °· You have nausea or vomiting. °· You have continued or worsening pain. °· You have trouble moving the injured part. °· You have other questions or concerns. °MAKE SURE YOU: °· Understand these instructions. °· Will watch your condition. °· Will get help right away if you are not doing well or get worse. °Document Released: 11/12/2010 Document Revised: 05/19/2011 Document  Reviewed: 11/12/2010 °[employer]® Patient Information ©2015 [employer]. This information is not intended to replace advice given to you by your health care provider. Make sure you discuss any questions you have with your health care provider. ° °Laceration Care, Adult °A laceration is a cut or lesion that goes through all layers of the skin and into the tissue just beneath the skin. °TREATMENT  °Some lacerations may not require closure. Some lacerations may not be able to be closed due to an increased risk of infection. It is important to see your caregiver as soon as possible after an injury to minimize the risk of infection and maximize the opportunity for successful closure. °If closure is appropriate, pain medicines may be given, if needed. The wound will be cleaned to help prevent infection. Your caregiver will use stitches (sutures), staples, wound glue (adhesive), or skin adhesive strips to repair the laceration. These tools bring the skin edges together to allow for faster healing and a better cosmetic outcome. However, all wounds will heal with a scar. Once the wound has healed, scarring can be minimized by covering the wound with sunscreen during the day for 1 full year. °HOME CARE INSTRUCTIONS  °For sutures or staples: °· Keep the wound clean and dry. °· If you were given a bandage (dressing), you should change it at least once a day. Also, change the dressing if it becomes wet or dirty, or as directed by your caregiver. °· Wash the wound with soap and water 2 times a day. Rinse the wound off with water to remove all soap. Pat the wound dry with a clean towel. °· After cleaning, apply a thin layer of the antibiotic ointment as recommended by your caregiver. This will help prevent infection and keep the dressing from sticking. °· You may shower as usual after the first 24 hours. Do not soak the wound in water until the sutures are removed. °· Only take over-the-counter or prescription medicines for pain,  discomfort, or fever as directed by your caregiver. °· Get your sutures or staples removed as directed by your caregiver. °For skin adhesive strips: °· Keep the wound clean and dry. °· Do not get the skin adhesive strips wet. You may bathe carefully, using caution to keep the wound dry. °· If the wound gets wet, pat it dry with a clean towel. °· Skin adhesive strips will fall off on their own. You may trim the strips as the wound heals. Do not remove skin adhesive strips that are still stuck to the wound. They will fall off in time. °For wound adhesive: °· You may briefly wet your wound in the shower or bath. Do not soak or scrub the wound. Do not swim. Avoid periods of heavy perspiration until the skin adhesive has fallen off on its own. After showering or bathing, gently pat the wound dry with a clean towel. °· Do not apply liquid medicine, cream medicine, or ointment medicine to your wound while   the skin adhesive is in place. This may loosen the film before your wound is healed. °· If a dressing is placed over the wound, be careful not to apply tape directly over the skin adhesive. This may cause the adhesive to be pulled off before the wound is healed. °· Avoid prolonged exposure to sunlight or tanning lamps while the skin adhesive is in place. Exposure to ultraviolet light in the first year will darken the scar. °· The skin adhesive will usually remain in place for 5 to 10 days, then naturally fall off the skin. Do not pick at the adhesive film. °You may need a tetanus shot if: °· You cannot remember when you had your last tetanus shot. °· You have never had a tetanus shot. °If you get a tetanus shot, your arm may swell, get red, and feel warm to the touch. This is common and not a problem. If you need a tetanus shot and you choose not to have one, there is a rare chance of getting tetanus. Sickness from tetanus can be serious. °SEEK MEDICAL CARE IF:  °· You have redness, swelling, or increasing pain in the  wound. °· You see a red line that goes away from the wound. °· You have yellowish-white fluid (pus) coming from the wound. °· You have a fever. °· You notice a bad smell coming from the wound or dressing. °· Your wound breaks open before or after sutures have been removed. °· You notice something coming out of the wound such as wood or glass. °· Your wound is on your hand or foot and you cannot move a finger or toe. °SEEK IMMEDIATE MEDICAL CARE IF:  °· Your pain is not controlled with prescribed medicine. °· You have severe swelling around the wound causing pain and numbness or a change in color in your arm, hand, leg, or foot. °· Your wound splits open and starts bleeding. °· You have worsening numbness, weakness, or loss of function of any joint around or beyond the wound. °· You develop painful lumps near the wound or on the skin anywhere on your body. °MAKE SURE YOU:  °· Understand these instructions. °· Will watch your condition. °· Will get help right away if you are not doing well or get worse. °Document Released: 02/24/2005 Document Revised: 05/19/2011 Document Reviewed: 08/20/2010 °Exit[employer]® Patient Information ©2015 [employer]. This information is not intended to replace advice given to you by your health care provider. Make sure you discuss any questions you have with your health care provider. ° °

## 2014-07-13 ENCOUNTER — Ambulatory Visit: Payer: BLUE CROSS/BLUE SHIELD

## 2014-07-14 ENCOUNTER — Ambulatory Visit (INDEPENDENT_AMBULATORY_CARE_PROVIDER_SITE_OTHER): Payer: BLUE CROSS/BLUE SHIELD | Admitting: *Deleted

## 2014-07-14 DIAGNOSIS — Z3040 Encounter for surveillance of contraceptives, unspecified: Secondary | ICD-10-CM

## 2014-07-14 MED ORDER — MEDROXYPROGESTERONE ACETATE 150 MG/ML IM SUSP
150.0000 mg | Freq: Once | INTRAMUSCULAR | Status: AC
  Administered 2014-07-14: 150 mg via INTRAMUSCULAR

## 2014-07-14 NOTE — Progress Notes (Signed)
Patient is within Depo Provera Calender Limits 5/5-5/19/16 Next Depo Due between: 7/22-10/12/14 Last AEX: 07/08/12 with TL AEX Scheduled: 08/08/14 with DL  Patient is aware, no problems or issues with previous Depo.  Pt tolerated Injection well in right ventrogluteal muscle  Routed to provider for review, encounter closed.

## 2014-08-08 ENCOUNTER — Ambulatory Visit: Payer: BC Managed Care – PPO | Admitting: Gynecology

## 2014-08-08 ENCOUNTER — Encounter: Payer: Self-pay | Admitting: Certified Nurse Midwife

## 2014-08-08 ENCOUNTER — Ambulatory Visit: Payer: BC Managed Care – PPO | Admitting: Certified Nurse Midwife

## 2014-09-18 ENCOUNTER — Telehealth: Payer: Self-pay | Admitting: Obstetrics and Gynecology

## 2014-09-18 NOTE — Telephone Encounter (Signed)
LMTCB about canceled appointment with Dr. Oscar LaJertson

## 2014-09-19 ENCOUNTER — Ambulatory Visit: Payer: BLUE CROSS/BLUE SHIELD | Admitting: Obstetrics and Gynecology

## 2014-09-29 ENCOUNTER — Ambulatory Visit (INDEPENDENT_AMBULATORY_CARE_PROVIDER_SITE_OTHER): Payer: BLUE CROSS/BLUE SHIELD

## 2014-09-29 VITALS — BP 108/70 | HR 78 | Resp 14 | Wt 152.6 lb

## 2014-09-29 DIAGNOSIS — Z3040 Encounter for surveillance of contraceptives, unspecified: Secondary | ICD-10-CM

## 2014-09-29 MED ORDER — MEDROXYPROGESTERONE ACETATE 150 MG/ML IM SUSP
150.0000 mg | Freq: Once | INTRAMUSCULAR | Status: AC
  Administered 2014-09-29: 150 mg via INTRAMUSCULAR

## 2014-09-29 NOTE — Progress Notes (Signed)
Patient is within Depo Provera Calender Limits 7/22-10/12/14 Next Depo Due between: 10/7-10/21/16 Last AEX: 08/03/13 with TL AEX Scheduled:10/19/14 JJ  Patient is aware when next depo is due.  Pt tolerated Injection well.  Encounter routed to provider.

## 2014-10-19 ENCOUNTER — Ambulatory Visit: Payer: BLUE CROSS/BLUE SHIELD | Admitting: Obstetrics and Gynecology

## 2014-10-20 ENCOUNTER — Ambulatory Visit (INDEPENDENT_AMBULATORY_CARE_PROVIDER_SITE_OTHER): Payer: BLUE CROSS/BLUE SHIELD | Admitting: Certified Nurse Midwife

## 2014-10-20 ENCOUNTER — Encounter: Payer: Self-pay | Admitting: Certified Nurse Midwife

## 2014-10-20 VITALS — BP 108/60 | HR 72 | Resp 16 | Ht 63.25 in | Wt 151.0 lb

## 2014-10-20 DIAGNOSIS — Z124 Encounter for screening for malignant neoplasm of cervix: Secondary | ICD-10-CM | POA: Diagnosis not present

## 2014-10-20 DIAGNOSIS — Z3042 Encounter for surveillance of injectable contraceptive: Secondary | ICD-10-CM | POA: Diagnosis not present

## 2014-10-20 DIAGNOSIS — Z01419 Encounter for gynecological examination (general) (routine) without abnormal findings: Secondary | ICD-10-CM

## 2014-10-20 MED ORDER — MEDROXYPROGESTERONE ACETATE 150 MG/ML IM SUSP: 150.0000 mg | INTRAMUSCULAR | Status: DC

## 2014-10-20 NOTE — Patient Instructions (Signed)
General topics  Next pap or exam is  due in 1 year Take a Women's multivitamin Take 1200 mg. of calcium daily - prefer dietary If any concerns in interim to call back  Breast Self-Awareness Practicing breast self-awareness may pick up problems early, prevent significant medical complications, and possibly save your life. By practicing breast self-awareness, you can become familiar with how your breasts look and feel and if your breasts are changing. This allows you to notice changes early. It can also offer you some reassurance that your breast health is good. One way to learn what is normal for your breasts and whether your breasts are changing is to do a breast self-exam. If you find a lump or something that was not present in the past, it is best to contact your caregiver right away. Other findings that should be evaluated by your caregiver include nipple discharge, especially if it is bloody; skin changes or reddening; areas where the skin seems to be pulled in (retracted); or new lumps and bumps. Breast pain is seldom associated with cancer (malignancy), but should also be evaluated by a caregiver. BREAST SELF-EXAM The best time to examine your breasts is 5 7 days after your menstrual period is over.  ExitCare Patient Information 2013 [employer].   Exercise to Stay Healthy Exercise helps you become and stay healthy. EXERCISE IDEAS AND TIPS Choose exercises that:  You enjoy.  Fit into your day. You do not need to exercise really hard to be healthy. You can do exercises at a slow or medium level and stay healthy. You can:  Stretch before and after working out.  Try yoga, Pilates, or tai chi.  Lift weights.  Walk fast, swim, jog, run, climb stairs, bicycle, dance, or rollerskate.  Take aerobic classes. Exercises that burn about 150 calories:  Running 1  miles in 15 minutes.  Playing volleyball for 45 to 60 minutes.  Washing and waxing a car for 45 to 60  minutes.  Playing touch football for 45 minutes.  Walking 1  miles in 35 minutes.  Pushing a stroller 1  miles in 30 minutes.  Playing basketball for 30 minutes.  Raking leaves for 30 minutes.  Bicycling 5 miles in 30 minutes.  Walking 2 miles in 30 minutes.  Dancing for 30 minutes.  Shoveling snow for 15 minutes.  Swimming laps for 20 minutes.  Walking up stairs for 15 minutes.  Bicycling 4 miles in 15 minutes.  Gardening for 30 to 45 minutes.  Jumping rope for 15 minutes.  Washing windows or floors for 45 to 60 minutes. Document Released: 03/29/2010 Document Revised: 05/19/2011 Document Reviewed: 03/29/2010 ExitCare Patient Information 2013 [employer].   Other topics ( that may be useful information):    Sexually Transmitted Disease Sexually transmitted disease (STD) refers to any infection that is passed from person to person during sexual activity. This may happen by way of saliva, semen, blood, vaginal mucus, or urine. Common STDs include:  Gonorrhea.  Chlamydia.  Syphilis.  HIV/AIDS.  Genital herpes.  Hepatitis B and C.  Trichomonas.  Human papillomavirus (HPV).  Pubic lice. CAUSES  An STD may be spread by bacteria, virus, or parasite. A person can get an STD by:  Sexual intercourse with an infected person.  Sharing sex toys with an infected person.  Sharing needles with an infected person.  Having intimate contact with the genitals, mouth, or rectal areas of an infected person. SYMPTOMS  Some people may not have any symptoms, but   they can still pass the infection to others. Different STDs have different symptoms. Symptoms include:  Painful or bloody urination.  Pain in the pelvis, abdomen, vagina, anus, throat, or eyes.  Skin rash, itching, irritation, growths, or sores (lesions). These usually occur in the genital or anal area.  Abnormal vaginal discharge.  Penile discharge in men.  Soft, flesh-colored skin growths in the  genital or anal area.  Fever.  Pain or bleeding during sexual intercourse.  Swollen glands in the groin area.  Yellow skin and eyes (jaundice). This is seen with hepatitis. DIAGNOSIS  To make a diagnosis, your caregiver may:  Take a medical history.  Perform a physical exam.  Take a specimen (culture) to be examined.  Examine a sample of discharge under a microscope.  Perform blood test TREATMENT   Chlamydia, gonorrhea, trichomonas, and syphilis can be cured with antibiotic medicine.  Genital herpes, hepatitis, and HIV can be treated, but not cured, with prescribed medicines. The medicines will lessen the symptoms.  Genital warts from HPV can be treated with medicine or by freezing, burning (electrocautery), or surgery. Warts may come back.  HPV is a virus and cannot be cured with medicine or surgery.However, abnormal areas may be followed very closely by your caregiver and may be removed from the cervix, vagina, or vulva through office procedures or surgery. If your diagnosis is confirmed, your recent sexual partners need treatment. This is true even if they are symptom-free or have a negative culture or evaluation. They should not have sex until their caregiver says it is okay. HOME CARE INSTRUCTIONS  All sexual partners should be informed, tested, and treated for all STDs.  Take your antibiotics as directed. Finish them even if you start to feel better.  Only take over-the-counter or prescription medicines for pain, discomfort, or fever as directed by your caregiver.  Rest.  Eat a balanced diet and drink enough fluids to keep your urine clear or pale yellow.  Do not have sex until treatment is completed and you have followed up with your caregiver. STDs should be checked after treatment.  Keep all follow-up appointments, Pap tests, and blood tests as directed by your caregiver.  Only use latex condoms and water-soluble lubricants during sexual activity. Do not use  petroleum jelly or oils.  Avoid alcohol and illegal drugs.  Get vaccinated for HPV and hepatitis. If you have not received these vaccines in the past, talk to your caregiver about whether one or both might be right for you.  Avoid risky sex practices that can break the skin. The only way to avoid getting an STD is to avoid all sexual activity.Latex condoms and dental dams (for oral sex) will help lessen the risk of getting an STD, but will not completely eliminate the risk. SEEK MEDICAL CARE IF:   You have a fever.  You have any new or worsening symptoms. Document Released: 05/17/2002 Document Revised: 05/19/2011 Document Reviewed: 05/24/2010 [facility] Patient Information 2013 [employer].    Domestic Abuse You are being battered or abused if someone close to you hits, pushes, or physically hurts you in any way. You also are being abused if you are forced into activities. You are being sexually abused if you are forced to have sexual contact of any kind. You are being emotionally abused if you are made to feel worthless or if you are constantly threatened. It is important to remember that help is available. No one has the right to abuse you. PREVENTION OF FURTHER  ABUSE  Learn the warning signs of danger. This varies with situations but may include: the use of alcohol, threats, isolation from friends and family, or forced sexual contact. Leave if you feel that violence is going to occur.  If you are attacked or beaten, report it to the police so the abuse is documented. You do not have to press charges. The police can protect you while you or the attackers are leaving. Get the officer's name and badge number and a copy of the report.  Find someone you can trust and tell them what is happening to you: your caregiver, a nurse, clergy member, close friend or family member. Feeling ashamed is natural, but remember that you have done nothing wrong. No one deserves abuse. Document Released:  02/22/2000 Document Revised: 05/19/2011 Document Reviewed: 05/02/2010 ExitCare Patient Information 2013 [employer].    How Much is Too Much Alcohol? Drinking too much alcohol can cause injury, accidents, and health problems. These types of problems can include:   Car crashes.  Falls.  Family fighting (domestic violence).  Drowning.  Fights.  Injuries.  Burns.  Damage to certain organs.  Having a baby with birth defects. ONE DRINK CAN BE TOO MUCH WHEN YOU ARE:  Working.  Pregnant or breastfeeding.  Taking medicines. Ask your doctor.  Driving or planning to drive. If you or someone you know has a drinking problem, get help from a doctor.  Document Released: 12/21/2008 Document Revised: 05/19/2011 Document Reviewed: 12/21/2008 ExitCare Patient Information 2013 [employer].   Smoking Hazards Smoking cigarettes is extremely bad for your health. Tobacco smoke has over 200 known poisons in it. There are over 60 chemicals in tobacco smoke that cause cancer. Some of the chemicals found in cigarette smoke include:   Cyanide.  Benzene.  Formaldehyde.  Methanol (wood alcohol).  Acetylene (fuel used in welding torches).  Ammonia. Cigarette smoke also contains the poisonous gases nitrogen oxide and carbon monoxide.  Cigarette smokers have an increased risk of many serious medical problems and Smoking causes approximately:  90% of all lung cancer deaths in men.  80% of all lung cancer deaths in women.  90% of deaths from chronic obstructive lung disease. Compared with nonsmokers, smoking increases the risk of:  Coronary heart disease by 2 to 4 times.  Stroke by 2 to 4 times.  Men developing lung cancer by 23 times.  Women developing lung cancer by 13 times.  Dying from chronic obstructive lung diseases by 12 times.  . Smoking is the most preventable cause of death and disease in our society.  WHY IS SMOKING ADDICTIVE?  Nicotine is the chemical  agent in tobacco that is capable of causing addiction or dependence.  When you smoke and inhale, nicotine is absorbed rapidly into the bloodstream through your lungs. Nicotine absorbed through the lungs is capable of creating a powerful addiction. Both inhaled and non-inhaled nicotine may be addictive.  Addiction studies of cigarettes and spit tobacco show that addiction to nicotine occurs mainly during the teen years, when young people begin using tobacco products. WHAT ARE THE BENEFITS OF QUITTING?  There are many health benefits to quitting smoking.   Likelihood of developing cancer and heart disease decreases. Health improvements are seen almost immediately.  Blood pressure, pulse rate, and breathing patterns start returning to normal soon after quitting. QUITTING SMOKING   American Lung Association - 1-800-LUNGUSA  American Cancer Society - 1-800-ACS-2345 Document Released: 04/03/2004 Document Revised: 05/19/2011 Document Reviewed: 12/06/2008 ExitCare Patient Information 2013 ExitCare[employer]   LL[employer].   Stress Management Stress is a state of physical or mental tension that often results from changes in your life or normal routine. Some common causes of stress are:  Death of a loved one.  Injuries or severe illnesses.  Getting fired or changing jobs.  Moving into a new home. Other causes may be:  Sexual problems.  Business or financial losses.  Taking on a large debt.  Regular conflict with someone at home or at work.  Constant tiredness from lack of sleep. It is not just bad things that are stressful. It may be stressful to:  Win the lottery.  Get married.  Buy a new car. The amount of stress that can be easily tolerated varies from person to person. Changes generally cause stress, regardless of the types of change. Too much stress can affect your health. It may lead to physical or emotional problems. Too little stress (boredom) may also become stressful. SUGGESTIONS TO  REDUCE STRESS:  Talk things over with your family and friends. It often is helpful to share your concerns and worries. If you feel your problem is serious, you may want to get help from a professional counselor.  Consider your problems one at a time instead of lumping them all together. Trying to take care of everything at once may seem impossible. List all the things you need to do and then start with the most important one. Set a goal to accomplish 2 or 3 things each day. If you expect to do too many in a single day you will naturally fail, causing you to feel even more stressed.  Do not use alcohol or drugs to relieve stress. Although you may feel better for a short time, they do not remove the problems that caused the stress. They can also be habit forming.  Exercise regularly - at least 3 times per week. Physical exercise can help to relieve that "uptight" feeling and will relax you.  The shortest distance between despair and hope is often a good night's sleep.  Go to bed and get up on time allowing yourself time for appointments without being rushed.  Take a short "time-out" period from any stressful situation that occurs during the day. Close your eyes and take some deep breaths. Starting with the muscles in your face, tense them, hold it for a few seconds, then relax. Repeat this with the muscles in your neck, shoulders, hand, stomach, back and legs.  Take good care of yourself. Eat a balanced diet and get plenty of rest.  Schedule time for having fun. Take a break from your daily routine to relax. HOME CARE INSTRUCTIONS   Call if you feel overwhelmed by your problems and feel you can no longer manage them on your own.  Return immediately if you feel like hurting yourself or someone else. Document Released: 08/20/2000 Document Revised: 05/19/2011 Document Reviewed: 04/12/2007 ExitCare Patient Information 2013 [employer]Care[employer].   

## 2014-10-20 NOTE — Progress Notes (Signed)
Reviewed personally.  M. Suzanne Jinna Weinman, MD.  

## 2014-10-20 NOTE — Progress Notes (Signed)
24 y.o. G0P0000 Single  Caucasian Fe here for annual exam. No periods with Depo Provera, very happy with choice. No partner change, no STD concerns of testing needed. Saw Urgent care for ear ache, uses urgent care prn. Had ER visit for dog bite, treated for infection. No health issues.  No LMP recorded. Patient has had an injection.          Sexually active: Yes.    The current method of family planning is Depo-Provera injections.    Exercising: No.  The patient does not participate in regular exercise at present. Smoker:  Former   Health Maintenance: Pap:  Never MMG:  Never Self Breast Check: yes, twice a month Gardasil: yes, completed  TDaP:  2016 Labs: PCP   reports that she has quit smoking. Her smoking use included Cigarettes. She has never used smokeless tobacco. She reports that she drinks alcohol. She reports that she uses illicit drugs (Marijuana).  Past Medical History  Diagnosis Date  . Substance abuse     previous pain pill addiction  . Headache(784.0)   . Amenorrhea     Past Surgical History  Procedure Laterality Date  . Gum surgery      age 32    Current Outpatient Prescriptions  Medication Sig Dispense Refill  . ibuprofen (ADVIL,MOTRIN) 200 MG tablet Take 200 mg by mouth as needed.    . medroxyPROGESTERone (DEPO-PROVERA) 150 MG/ML injection Inject 1 mL (150 mg total) into the muscle every 3 (three) months. 1 mL 3   No current facility-administered medications for this visit.    Family History  Problem Relation Age of Onset  . Diabetes Paternal Grandmother   . Glaucoma Paternal Grandmother   . Aneurysm Mother     ROS:  Pertinent items are noted in HPI.  Otherwise, a comprehensive ROS was negative.  Exam:   BP 108/60 mmHg  Pulse 72  Resp 16  Ht 5' 3.25" (1.607 m)  Wt 151 lb (68.493 kg)  BMI 26.52 kg/m2 Height: 5' 3.25" (160.7 cm) Ht Readings from Last 3 Encounters:  10/20/14 5' 3.25" (1.607 m)  04/26/14 5' 2.75" (1.594 m)  02/08/14 5' 2.75"  (1.594 m)    General appearance: alert, cooperative and appears stated age Head: Normocephalic, without obvious abnormality, atraumatic Neck: no adenopathy, supple, symmetrical, trachea midline and thyroid normal to inspection and palpation Lungs: clear to auscultation bilaterally Breasts: normal appearance, no masses or tenderness, No nipple retraction or dimpling, No nipple discharge or bleeding, No axillary or supraclavicular adenopathy Heart: regular rate and rhythm Abdomen: soft, non-tender; no masses,  no organomegaly Extremities: extremities normal, atraumatic, no cyanosis or edema Skin: Skin color, texture, turgor normal. No rashes or lesions Lymph nodes: Cervical, supraclavicular, and axillary nodes normal. No abnormal inguinal nodes palpated Neurologic: Grossly normal   Pelvic: External genitalia:  no lesions              Urethra:  normal appearing urethra with no masses, tenderness or lesions              Bartholin's and Skene's: normal                 Vagina: normal appearing vagina with normal color and discharge, no lesions              Cervix: normal,non tender,no lesions              Pap taken: Yes.   Bimanual Exam:  Uterus:  normal size, contour, position, consistency,  mobility, non-tender              Adnexa: normal adnexa and no mass, fullness, tenderness               Rectovaginal: Confirms               Anus:  normal sphincter tone, no lesions  Chaperone present: Yes  A:  Well Woman with normal exam  Contraception Depo Provera desired  Weight gain, patient aware      P:   Reviewed health and wellness pertinent to exam  Rx Depo Provera 150 mg IM every 3 months x 4  Will monitor with Depo use. Patient is working on and will watch portion control.  Pap smear as above with HPV reflex   counseled on breast self exam, STD prevention, HIV risk factors and prevention, adequate intake of calcium and vitamin D, diet and exercise  return annually or prn  An  After Visit Summary was printed and given to the patient.

## 2014-10-25 LAB — IPS PAP TEST WITH REFLEX TO HPV

## 2014-10-26 ENCOUNTER — Telehealth: Payer: Self-pay

## 2014-10-26 NOTE — Telephone Encounter (Signed)
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 Due to her age the HPV can spontaneously resolve. Important that she have repeat one year. 08  If she has not taken Gardasil( which is not noted that she has, she should consider this due to the [facility]) If questions schedule OV

## 2014-10-26 NOTE — Telephone Encounter (Signed)
[email or web address]

## 2014-10-26 NOTE — Telephone Encounter (Signed)
Patient notified of results. See lab 

## 2014-12-15 ENCOUNTER — Ambulatory Visit (INDEPENDENT_AMBULATORY_CARE_PROVIDER_SITE_OTHER): Payer: BLUE CROSS/BLUE SHIELD | Admitting: *Deleted

## 2014-12-15 VITALS — BP 98/60 | HR 68 | Resp 16 | Ht 63.25 in | Wt 153.0 lb

## 2014-12-15 DIAGNOSIS — Z3040 Encounter for surveillance of contraceptives, unspecified: Secondary | ICD-10-CM

## 2014-12-15 DIAGNOSIS — Z01419 Encounter for gynecological examination (general) (routine) without abnormal findings: Secondary | ICD-10-CM

## 2014-12-15 DIAGNOSIS — Z0000 Encounter for general adult medical examination without abnormal findings: Secondary | ICD-10-CM

## 2014-12-15 MED ORDER — MEDROXYPROGESTERONE ACETATE 150 MG/ML IM SUSP
150.0000 mg | Freq: Once | INTRAMUSCULAR | Status: AC
  Administered 2014-12-15: 150 mg via INTRAMUSCULAR

## 2014-12-15 NOTE — Progress Notes (Signed)
Patient is here for Depo Provera Injection Patient is within Depo Provera Calender Limits yes, last Depo 09/29/14  Next Depo Due between: 12/23- 1/6  Last AEX: 10/20/14 AEX Scheduled: 10/25/15   Patient is aware when next depo is due  Pt tolerated Injection well.  Routed to provider for review, encounter closed.

## 2015-03-06 ENCOUNTER — Ambulatory Visit (INDEPENDENT_AMBULATORY_CARE_PROVIDER_SITE_OTHER): Payer: BLUE CROSS/BLUE SHIELD | Admitting: *Deleted

## 2015-03-06 VITALS — BP 102/58 | HR 76 | Resp 18 | Ht 63.25 in | Wt 159.0 lb

## 2015-03-06 DIAGNOSIS — Z3040 Encounter for surveillance of contraceptives, unspecified: Secondary | ICD-10-CM | POA: Diagnosis not present

## 2015-03-06 MED ORDER — MEDROXYPROGESTERONE ACETATE 150 MG/ML IM SUSP
150.0000 mg | Freq: Once | INTRAMUSCULAR | Status: AC
  Administered 2015-03-06: 150 mg via INTRAMUSCULAR

## 2015-03-06 NOTE — Progress Notes (Signed)
Patient is here for Depo Provera Injection Patient is within Depo Provera Calender Limits yes, last depo 12/15/14  Next Depo Due between: 3/14-3/28 Last AEX: 10/20/14 DL AEX Scheduled: 7/25/368/17/17 DL  Patient is aware when next depo is due  Pt tolerated Injection well.  Routed to provider for review, encounter closed.

## 2015-05-22 ENCOUNTER — Ambulatory Visit (INDEPENDENT_AMBULATORY_CARE_PROVIDER_SITE_OTHER): Payer: Managed Care, Other (non HMO)

## 2015-05-22 VITALS — BP 116/62 | HR 72 | Resp 12 | Ht 63.25 in | Wt 158.2 lb

## 2015-05-22 DIAGNOSIS — Z3042 Encounter for surveillance of injectable contraceptive: Secondary | ICD-10-CM | POA: Diagnosis not present

## 2015-05-22 MED ORDER — MEDROXYPROGESTERONE ACETATE 150 MG/ML IM SUSP
150.0000 mg | Freq: Once | INTRAMUSCULAR | Status: AC
  Administered 2015-05-22: 150 mg via INTRAMUSCULAR

## 2015-05-22 NOTE — Progress Notes (Signed)
Patient is here for Depo Provera Injection Patient is within Depo Provera Calender Limits  Next Depo Due between: May 30-June 13 Last AEX: 12-15-14 AEX Scheduled: 10-25-15  Patient is aware when next depo is due  Pt tolerated Injection well in her left ventrogluteal muscle  Routed to provider for review, encounter closed.

## 2015-07-20 ENCOUNTER — Observation Stay (HOSPITAL_COMMUNITY)
Admission: EM | Admit: 2015-07-20 | Discharge: 2015-07-21 | Disposition: A | Discharge status: Home / Self Care | Payer: Managed Care, Other (non HMO) | Attending: Orthopedic Surgery | Admitting: Orthopedic Surgery

## 2015-07-20 ENCOUNTER — Encounter (HOSPITAL_COMMUNITY): Payer: Self-pay

## 2015-07-20 ENCOUNTER — Emergency Department (HOSPITAL_COMMUNITY): Payer: Managed Care, Other (non HMO)

## 2015-07-20 ENCOUNTER — Encounter (HOSPITAL_COMMUNITY)
Admission: EM | Disposition: A | Discharge status: Home / Self Care | Payer: Self-pay | Source: Home / Self Care | Attending: Emergency Medicine

## 2015-07-20 DIAGNOSIS — Y9289 Other specified places as the place of occurrence of the external cause: Secondary | ICD-10-CM | POA: Diagnosis not present

## 2015-07-20 DIAGNOSIS — Z87891 Personal history of nicotine dependence: Secondary | ICD-10-CM | POA: Insufficient documentation

## 2015-07-20 DIAGNOSIS — W540XXA Bitten by dog, initial encounter: Secondary | ICD-10-CM | POA: Insufficient documentation

## 2015-07-20 DIAGNOSIS — S0183XA Puncture wound without foreign body of other part of head, initial encounter: Secondary | ICD-10-CM | POA: Insufficient documentation

## 2015-07-20 DIAGNOSIS — Y998 Other external cause status: Secondary | ICD-10-CM | POA: Insufficient documentation

## 2015-07-20 DIAGNOSIS — S52601B Unspecified fracture of lower end of right ulna, initial encounter for open fracture type I or II: Secondary | ICD-10-CM

## 2015-07-20 DIAGNOSIS — Z881 Allergy status to other antibiotic agents status: Secondary | ICD-10-CM | POA: Insufficient documentation

## 2015-07-20 DIAGNOSIS — Y9389 Activity, other specified: Secondary | ICD-10-CM | POA: Diagnosis not present

## 2015-07-20 DIAGNOSIS — S51811A Laceration without foreign body of right forearm, initial encounter: Secondary | ICD-10-CM | POA: Insufficient documentation

## 2015-07-20 DIAGNOSIS — S52601A Unspecified fracture of lower end of right ulna, initial encounter for closed fracture: Secondary | ICD-10-CM | POA: Diagnosis present

## 2015-07-20 DIAGNOSIS — S51851A Open bite of right forearm, initial encounter: Secondary | ICD-10-CM

## 2015-07-20 DIAGNOSIS — S71152A Open bite, left thigh, initial encounter: Secondary | ICD-10-CM

## 2015-07-20 DIAGNOSIS — S52691B Other fracture of lower end of right ulna, initial encounter for open fracture type I or II: Principal | ICD-10-CM | POA: Insufficient documentation

## 2015-07-20 DIAGNOSIS — S41111A Laceration without foreign body of right upper arm, initial encounter: Secondary | ICD-10-CM

## 2015-07-20 DIAGNOSIS — Z9889 Other specified postprocedural states: Secondary | ICD-10-CM

## 2015-07-20 HISTORY — DX: Unspecified fracture of lower end of right ulna, initial encounter for closed fracture: S52.601A

## 2015-07-20 HISTORY — PX: I & D EXTREMITY: SHX5045

## 2015-07-20 HISTORY — PX: ORIF ULNAR FRACTURE: SHX5417

## 2015-07-20 SURGERY — OPEN REDUCTION INTERNAL FIXATION (ORIF) ULNAR FRACTURE
Anesthesia: General | Site: Arm Lower | Laterality: Right

## 2015-07-20 MED ORDER — LIDOCAINE 2% (20 MG/ML) 5 ML SYRINGE
INTRAMUSCULAR | Status: AC
  Filled 2015-07-20: qty 5

## 2015-07-20 MED ORDER — LIDOCAINE HCL (PF) 1 % IJ SOLN
INTRAMUSCULAR | Status: AC
  Filled 2015-07-20: qty 30

## 2015-07-20 MED ORDER — SODIUM CHLORIDE 0.9 % IV BOLUS (SEPSIS)
1000.0000 mL | Freq: Once | INTRAVENOUS | Status: AC
  Administered 2015-07-20: 1000 mL via INTRAVENOUS

## 2015-07-20 MED ORDER — LIDOCAINE-EPINEPHRINE-TETRACAINE (LET) SOLUTION
3.0000 mL | Freq: Once | NASAL | Status: AC
  Administered 2015-07-20: 3 mL via TOPICAL
  Filled 2015-07-20: qty 3

## 2015-07-20 MED ORDER — SUCCINYLCHOLINE CHLORIDE 200 MG/10ML IV SOSY
PREFILLED_SYRINGE | INTRAVENOUS | Status: AC
  Filled 2015-07-20: qty 10

## 2015-07-20 MED ORDER — BUPIVACAINE HCL (PF) 0.25 % IJ SOLN
INTRAMUSCULAR | Status: AC
  Filled 2015-07-20: qty 30

## 2015-07-20 MED ORDER — ONDANSETRON HCL 4 MG/2ML IJ SOLN
INTRAMUSCULAR | Status: AC
  Filled 2015-07-20: qty 2

## 2015-07-20 MED ORDER — LIDOCAINE-EPINEPHRINE 2 %-1:100000 IJ SOLN: 20.0000 mL | Freq: Once | INTRAMUSCULAR | Status: DC

## 2015-07-20 MED ORDER — PROPOFOL 10 MG/ML IV BOLUS
INTRAVENOUS | Status: AC
  Filled 2015-07-20: qty 20

## 2015-07-20 MED ORDER — ROCURONIUM BROMIDE 50 MG/5ML IV SOLN
INTRAVENOUS | Status: AC
  Filled 2015-07-20: qty 1

## 2015-07-20 MED ORDER — HYDROMORPHONE HCL 1 MG/ML IJ SOLN
1.0000 mg | Freq: Once | INTRAMUSCULAR | Status: AC
  Administered 2015-07-20: 1 mg via INTRAVENOUS
  Filled 2015-07-20: qty 1

## 2015-07-20 MED ORDER — CEFAZOLIN SODIUM 1-5 GM-% IV SOLN
1.0000 g | Freq: Once | INTRAVENOUS | Status: AC
  Administered 2015-07-20: 1 g via INTRAVENOUS
  Filled 2015-07-20: qty 50

## 2015-07-20 MED ORDER — LIDOCAINE-EPINEPHRINE (PF) 2 %-1:200000 IJ SOLN
INTRAMUSCULAR | Status: AC
  Filled 2015-07-20: qty 20

## 2015-07-20 MED ORDER — SUFENTANIL CITRATE 50 MCG/ML IV SOLN
INTRAVENOUS | Status: AC
  Filled 2015-07-20: qty 1

## 2015-07-20 MED ORDER — ONDANSETRON HCL 4 MG/2ML IJ SOLN
4.0000 mg | Freq: Once | INTRAMUSCULAR | Status: AC
  Administered 2015-07-20: 4 mg via INTRAVENOUS
  Filled 2015-07-20: qty 2

## 2015-07-20 MED ORDER — MIDAZOLAM HCL 2 MG/2ML IJ SOLN
INTRAMUSCULAR | Status: AC
  Filled 2015-07-20: qty 2

## 2015-07-20 SURGICAL SUPPLY — 90 items
BANDAGE COBAN STERILE 2 (GAUZE/BANDAGES/DRESSINGS) ×1 IMPLANT
BANDAGE ESMARK 6X9 LF (GAUZE/BANDAGES/DRESSINGS) IMPLANT
BIT DRILL 2.5X2.75 QC CALB (BIT) ×1 IMPLANT
BIT DRILL CALIBRATED 2.7 (BIT) ×1 IMPLANT
BLADE MINI RND TIP GREEN BEAV (BLADE) IMPLANT
BLADE SURG 15 STRL LF DISP TIS (BLADE) ×1 IMPLANT
BLADE SURG 15 STRL SS (BLADE) ×2
BNDG CMPR 9X4 STRL LF SNTH (GAUZE/BANDAGES/DRESSINGS) ×1
BNDG CMPR 9X6 STRL LF SNTH (GAUZE/BANDAGES/DRESSINGS) ×1
BNDG COHESIVE 4X5 TAN STRL (GAUZE/BANDAGES/DRESSINGS) ×2 IMPLANT
BNDG ESMARK 4X9 LF (GAUZE/BANDAGES/DRESSINGS) ×2 IMPLANT
BNDG ESMARK 6X9 LF (GAUZE/BANDAGES/DRESSINGS) ×2
BNDG GAUZE ELAST 4 BULKY (GAUZE/BANDAGES/DRESSINGS) ×4 IMPLANT
BRUSH SCRUB EZ PLAIN DRY (MISCELLANEOUS) ×1 IMPLANT
CANISTER SUCTION 2500CC (MISCELLANEOUS) ×2 IMPLANT
CHLORAPREP W/TINT 26ML (MISCELLANEOUS) ×2 IMPLANT
CORDS BIPOLAR (ELECTRODE) ×2 IMPLANT
COVER SURGICAL LIGHT HANDLE (MISCELLANEOUS) ×2 IMPLANT
COVER TABLE BACK 60X90 (DRAPES) ×2 IMPLANT
CUFF TOURNIQUET SINGLE 18IN (TOURNIQUET CUFF) ×2 IMPLANT
CUFF TOURNIQUET SINGLE 24IN (TOURNIQUET CUFF) IMPLANT
DRAPE C-ARM 42X72 X-RAY (DRAPES) ×2 IMPLANT
DRAPE SURG 17X23 STRL (DRAPES) ×2 IMPLANT
DRSG ADAPTIC 3X8 NADH LF (GAUZE/BANDAGES/DRESSINGS) ×3 IMPLANT
DRSG EMULSION OIL 3X3 NADH (GAUZE/BANDAGES/DRESSINGS) IMPLANT
ELECT REM PT RETURN 9FT ADLT (ELECTROSURGICAL) ×2
ELECTRODE REM PT RTRN 9FT ADLT (ELECTROSURGICAL) IMPLANT
EVACUATOR 1/8 PVC DRAIN (DRAIN) IMPLANT
GAUZE SPONGE 4X4 12PLY STRL (GAUZE/BANDAGES/DRESSINGS) ×2 IMPLANT
GLOVE BIO SURGEON STRL SZ 6.5 (GLOVE) ×2 IMPLANT
GLOVE BIO SURGEON STRL SZ7 (GLOVE) ×1 IMPLANT
GLOVE BIO SURGEON STRL SZ7.5 (GLOVE) ×3 IMPLANT
GLOVE BIOGEL PI IND STRL 7.0 (GLOVE) ×1 IMPLANT
GLOVE BIOGEL PI IND STRL 7.5 (GLOVE) IMPLANT
GLOVE BIOGEL PI IND STRL 8 (GLOVE) ×1 IMPLANT
GLOVE BIOGEL PI INDICATOR 7.0 (GLOVE) ×1
GLOVE BIOGEL PI INDICATOR 7.5 (GLOVE) ×2
GLOVE BIOGEL PI INDICATOR 8 (GLOVE) ×2
GOWN STRL REUS W/ TWL LRG LVL3 (GOWN DISPOSABLE) ×3 IMPLANT
GOWN STRL REUS W/ TWL XL LVL3 (GOWN DISPOSABLE) ×1 IMPLANT
GOWN STRL REUS W/TWL LRG LVL3 (GOWN DISPOSABLE) ×6
GOWN STRL REUS W/TWL XL LVL3 (GOWN DISPOSABLE) ×2
HANDPIECE INTERPULSE COAX TIP (DISPOSABLE)
KIT BASIN OR (CUSTOM PROCEDURE TRAY) ×2 IMPLANT
KIT ROOM TURNOVER OR (KITS) ×2 IMPLANT
MANIFOLD NEPTUNE II (INSTRUMENTS) ×2 IMPLANT
NDL HYPO 25GX1X1/2 BEV (NEEDLE) IMPLANT
NDL HYPO 25X1 1.5 SAFETY (NEEDLE) IMPLANT
NEEDLE HYPO 25GX1X1/2 BEV (NEEDLE) ×2 IMPLANT
NEEDLE HYPO 25X1 1.5 SAFETY (NEEDLE) ×2 IMPLANT
NS IRRIG 1000ML POUR BTL (IV SOLUTION) ×2 IMPLANT
PACK ORTHO EXTREMITY (CUSTOM PROCEDURE TRAY) ×2 IMPLANT
PAD ARMBOARD 7.5X6 YLW CONV (MISCELLANEOUS) ×4 IMPLANT
PAD CAST 4YDX4 CTTN HI CHSV (CAST SUPPLIES) ×1 IMPLANT
PADDING CAST ABS 4INX4YD NS (CAST SUPPLIES) ×1
PADDING CAST ABS COTTON 4X4 ST (CAST SUPPLIES) IMPLANT
PADDING CAST COTTON 4X4 STRL (CAST SUPPLIES) ×2
PENCIL BUTTON HOLSTER BLD 10FT (ELECTRODE) ×1 IMPLANT
PLATE LOCK COMP 7H FOOT (Plate) ×1 IMPLANT
RUBBERBAND STERILE (MISCELLANEOUS) IMPLANT
SCREW CORTICAL 3.5MM  16MM (Screw) ×2 IMPLANT
SCREW CORTICAL 3.5MM 16MM (Screw) IMPLANT
SCREW CORTICAL 3.5MM 18MM (Screw) ×1 IMPLANT
SCREW LOCK CORT STAR 3.5X10 (Screw) ×2 IMPLANT
SCREW LOCK CORT STAR 3.5X12 (Screw) ×2 IMPLANT
SET CYSTO W/LG BORE CLAMP LF (SET/KITS/TRAYS/PACK) IMPLANT
SET HNDPC FAN SPRY TIP SCT (DISPOSABLE) IMPLANT
SET IRRIG Y TYPE TUR BLADDER L (SET/KITS/TRAYS/PACK) IMPLANT
SLING ARM FOAM STRAP LRG (SOFTGOODS) ×1 IMPLANT
SOL PREP POV-IOD 4OZ 10% (MISCELLANEOUS) ×1 IMPLANT
SPONGE LAP 18X18 X RAY DECT (DISPOSABLE) IMPLANT
STOCKINETTE IMPERVIOUS 9X36 MD (GAUZE/BANDAGES/DRESSINGS) IMPLANT
SUCTION FRAZIER TIP 8 FR DISP (SUCTIONS) ×1
SUCTION TUBE FRAZIER 8FR DISP (SUCTIONS) IMPLANT
SUT ETHILON 3 0 PS 1 (SUTURE) ×2 IMPLANT
SUT ETHILON 4 0 PS 2 18 (SUTURE) IMPLANT
SUT PROLENE 1 CT (SUTURE) IMPLANT
SUT VIC AB 2-0 CT3 27 (SUTURE) ×2 IMPLANT
SUT VICRYL 4-0 PS2 18IN ABS (SUTURE) IMPLANT
SUT VICRYL RAPIDE 4/0 PS 2 (SUTURE) ×4 IMPLANT
SYR CONTROL 10ML LL (SYRINGE) ×1 IMPLANT
SYRINGE 10CC LL (SYRINGE) IMPLANT
TOWEL OR 17X24 6PK STRL BLUE (TOWEL DISPOSABLE) ×2 IMPLANT
TOWEL OR 17X26 10 PK STRL BLUE (TOWEL DISPOSABLE) ×3 IMPLANT
TUBE ANAEROBIC SPECIMEN COL (MISCELLANEOUS) IMPLANT
TUBE CONNECTING 12X1/4 (SUCTIONS) ×2 IMPLANT
TUBING CYSTO DISP (UROLOGICAL SUPPLIES) ×2 IMPLANT
UNDERPAD 30X30 INCONTINENT (UNDERPADS AND DIAPERS) ×2 IMPLANT
WATER STERILE IRR 1000ML POUR (IV SOLUTION) ×2 IMPLANT
YANKAUER SUCT BULB TIP NO VENT (SUCTIONS) ×2 IMPLANT

## 2015-07-20 NOTE — Anesthesia Preprocedure Evaluation (Addendum)
Anesthesia Evaluation  Patient identified by MRN, date of birth, ID band Patient awake    Reviewed: Allergy & Precautions, NPO status , Patient's Chart, lab work & pertinent test results  History of Anesthesia Complications Negative for: history of anesthetic complications  Airway Mallampati: II  TM Distance: >3 FB Neck ROM: Full    Dental no notable dental hx. (+) Teeth Intact, Dental Advisory Given   Pulmonary neg pulmonary ROS, former smoker,    Pulmonary exam normal breath sounds clear to auscultation       Cardiovascular negative cardio ROS Normal cardiovascular exam Rhythm:Regular Rate:Normal     Neuro/Psych negative neurological ROS  negative psych ROS   GI/Hepatic negative GI ROS, Previous pain pill addiction   Endo/Other  negative endocrine ROS  Renal/GU negative Renal ROS  negative genitourinary   Musculoskeletal negative musculoskeletal ROS (+)   Abdominal   Peds negative pediatric ROS (+)  Hematology negative hematology ROS (+)   Anesthesia Other Findings   Reproductive/Obstetrics negative OB ROS                           Anesthesia Physical Anesthesia Plan  ASA: II and emergent  Anesthesia Plan: General   Post-op Pain Management:    Induction: Intravenous  Airway Management Planned: Oral ETT  Additional Equipment:   Intra-op Plan:   Post-operative Plan: Extubation in OR  Informed Consent: I have reviewed the patients History and Physical, chart, labs and discussed the procedure including the risks, benefits and alternatives for the proposed anesthesia with the patient or authorized representative who has indicated his/her understanding and acceptance.   Dental advisory given  Plan Discussed with: CRNA and Surgeon  Anesthesia Plan Comments:         Anesthesia Quick Evaluation

## 2015-07-20 NOTE — Consult Note (Signed)
ORTHOPAEDIC CONSULTATION HISTORY & PHYSICAL REQUESTING PHYSICIAN: Melene Planan Floyd, DO  Chief Complaint: Right forearm injury  HPI: Maria Chang is a 25 y.o. female whose right forearm was injured in an dog attack earlier this evening.  She presented with multiple lacerations about the distal forearm, some tingling in the ring and small fingers.  In the ER earlier, absent radial pulse to palpation was noted, with some difficulty obtaining a Doppler signal there as well.  Past Medical History  Diagnosis Date  . Substance abuse     previous pain pill addiction  . Headache(784.0)   . Amenorrhea    Past Surgical History  Procedure Laterality Date  . Gum surgery      age 25   Social History   Social History  . Marital Status: Single    Spouse Name: N/A  . Number of Children: N/A  . Years of Education: N/A   Social History Main Topics  . Smoking status: Former Smoker    Types: Cigarettes  . Smokeless tobacco: Never Used     Comment: 4  day  . Alcohol Use: Yes     Comment: occ  . Drug Use: Yes    Special: Marijuana     Comment: once every other day  . Sexual Activity:    Partners: Male    Birth Control/ Protection: Injection     Comment: depo injection   Other Topics Concern  . None   Social History Narrative   Family History  Problem Relation Age of Onset  . Diabetes Paternal Grandmother   . Glaucoma Paternal Grandmother   . Aneurysm Mother    Allergies  Allergen Reactions  . Zithromax [Azithromycin] Rash   Prior to Admission medications   Medication Sig Start Date End Date Taking? Authorizing Provider  medroxyPROGESTERone (DEPO-PROVERA) 150 MG/ML injection Inject 1 mL (150 mg total) into the muscle every 3 (three) months. 10/20/14  Yes Verner Choleborah S Leonard, CNM   Dg Forearm Right  07/20/2015  CLINICAL DATA:  25 year old female attacked by a pit bull. Laceration on the anterior aspect of the right forearm. Numbness in the hand. EXAM: RIGHT FOREARM - 2 VIEW  COMPARISON:  No priors. FINDINGS: Soft tissues over the distal aspect of the form are regular, suggestive of multiple soft tissue lacerations and puncture wounds. No retained radiopaque foreign body in the soft tissues is identified. The radius appears intact. The distal third of the ulnar diaphysis is fractured. This fracture is mildly comminuted and displaced approximately 1 shaft width anteriorly. Minimal to no angulation. IMPRESSION: 1. Acute mildly comminuted displaced fracture of the distal third of the ulnar diaphysis. 2. Soft tissue lacerations and puncture wounds to the distal aspect of the right forearm, as above. Electronically Signed   By: Trudie Reedaniel  Entrikin M.D.   On: 07/20/2015 19:52    Positive ROS: All other systems have been reviewed and were otherwise negative with the exception of those mentioned in the HPI and as above.  Physical Exam: Vitals: Refer to EMR. Constitutional:  WD, WN, NAD HEENT:  NCAT, EOMI Neuro/Psych:  Alert & oriented to person, place, and time; appropriate mood & affect Lymphatic: No generalized extremity edema or lymphadenopathy Extremities / MSK:  The extremities are normal with respect to appearance, ranges of motion, joint stability, muscle strength/tone, sensation, & perfusion except as otherwise noted:  There are multiple lacerations about the left forearm, some of which are volar radial and the distal forearm others more ulnar area and she has a  towel with her that was used to help control bleeding, which was not pulsatile.  There is no active bleeding presently.  Her fingers are warm, capillary refill about 1 second.  It appears as if the hand fills appropriately with balance testing to both radial and ulnar arteries.  At present, it feels as if there is weakly palpable pulses at the wrist of both radial and ulnar arteries.  Digital flexor tendons are intact to testing, including all the FDP, FDS, and FPL tendon.  Intact light touch sensibility in the median  distribution, intact but altered and tingly sensation in the ulnar distribution.  There is also some tingling in the dorsal first webspace.  Finger abduction is intact, as is flexion and extension.  Forearm compartments are soft  Assessment: Complex right forearm lacerations, with comminuted displaced fracture of the ulnar diaphysis near the junction of the middle and distal thirds.  In addition there is some concern regarding the integrity of the radial artery, although the hand seems adequately perfused.  Plan: I discussed these findings with her and recommended exploration of the wounds in the operating room, debridement of devitalized tissue, closure of the wounds, with likely skeletal stabilization of the ulna fracture.  Additionally, we will repair deeper structures such as tendons, nerves, and arteries as indicated.  She understands that she will be at least placed in observation overnight for an additional 1-2 doses of IV antibiotics and a minimum.  Questions are invited and answered, consent obtained, and document executed.  Cliffton Asters Janee Morn, MD      Orthopaedic & Hand Surgery [facility] Orthopaedic & Sports Medicine [facility] [address] [place], [place]  [address] Office: [phone] Mobile: [phone]  07/20/2015, 11:16 PM

## 2015-07-20 NOTE — ED Notes (Addendum)
Pt attacked by pit bull.  Boyfriend dog.  Up to date on all shots.  Moderate laceration to rt anterior forearm.  Deep laceration to rt posterior forearm.  Pt states numbness to hand.  Capillary refill <3. Fingertips cool to touch Puncture sites to left anterior thigh.  Scratch to rt foot.

## 2015-07-20 NOTE — ED Provider Notes (Signed)
CSN: [id number]     Arrival date & time 07/20/15  1841 History   First MD Initiated Contact with Patient 07/20/15 1849     Chief Complaint  Patient presents with  . Animal Bite     (Consider location/radiation/quality/duration/timing/severity/associated sxs/prior Treatment) HPI   25 year old female with history of opiate abuse, headache, presenting for evaluation of dog bite. Patient states prior to arrival, she was showering at home, and a spider fell on her.  She called her boyfriend to come help her, and between all of the locomotion her boyfriend's dog became "over protective" and try to bite her boyfriend. Pt tries to intervene and was bit several times.  She c/o acute onset of sharp stabbing pain to R forearm and L thigh from dog bite.  She denies any other injuries.  Denies numbness distal to the bite.  Pain is 10/10, no specific treatment tried. She is UTD with tetanus.  She has been bitten by the same dog in the past.  Her last meal was this morning. Pt is R hand dominant.    Past Medical History  Diagnosis Date  . Substance abuse     previous pain pill addiction  . Headache(784.0)   . Amenorrhea    Past Surgical History  Procedure Laterality Date  . Gum surgery      age 6   Family History  Problem Relation Age of Onset  . Diabetes Paternal Grandmother   . Glaucoma Paternal Grandmother   . Aneurysm Mother    Social History  Substance Use Topics  . Smoking status: Former Smoker    Types: Cigarettes  . Smokeless tobacco: Never Used     Comment: 4  day  . Alcohol Use: Yes     Comment: occ   OB History    Gravida Para Term Preterm AB TAB SAB Ectopic Multiple Living       Review of Systems  Constitutional: Negative for fever.  Skin: Positive for wound.  Neurological: Negative for numbness.      Allergies  Zithromax  Home Medications   Prior to Admission medications   Medication Sig Start Date End Date Taking? Authorizing Provider    medroxyPROGESTERone (DEPO-PROVERA) 150 MG/ML injection Inject 1 mL (150 mg total) into the muscle every 3 (three) months. 10/20/14  Yes Verner Chol, CNM   There were no vitals taken for this visit. Physical Exam  Constitutional: She is oriented to person, place, and time. She appears well-developed and well-nourished. No distress.  Caucasian female sitting, holding R forearm in mild discomfort.    HENT:  Head: Atraumatic.  Eyes: Conjunctivae are normal.  Neck: Neck supple.  Musculoskeletal: She exhibits tenderness (Right forearm: 7 deep puncture wound, ranging from less than 1 cm to 5 cm with subcutaneous fat and musculature involvement 6 deep puncture wound noted to the dorsum of right forearm.Marland Kitchen).  Right forearm: Unable to palpate radial pulse. Ulnar pulses palpable. Right hand is cool to the touch with Refill around 3 seconds. Right forearm with crepitus along the ulnar aspect with exquisite tenderness to palpation.  Left anterior thigh with 3 superficial puncture wound not actively bleeding. Tenderness to palpation but no crepitus.  Several skin abrasion to bilateral knee and right foot.  Neurological: She is alert and oriented to person, place, and time.  Skin: No rash noted.  Psychiatric: She has a normal mood and affect.  Nursing note and vitals reviewed.  ED Course  Procedures (including critical care time) Labs Review Labs Reviewed - No data to display  Imaging Review Dg Forearm Right  07/20/2015  CLINICAL DATA:  25 year old female attacked by a pit bull. Laceration on the anterior aspect of the right forearm. Numbness in the hand. EXAM: RIGHT FOREARM - 2 VIEW COMPARISON:  No priors. FINDINGS: Soft tissues over the distal aspect of the form are regular, suggestive of multiple soft tissue lacerations and puncture wounds. No retained radiopaque foreign body in the soft tissues is identified. The radius appears intact. The distal third of the ulnar diaphysis is fractured.  This fracture is mildly comminuted and displaced approximately 1 shaft width anteriorly. Minimal to no angulation. IMPRESSION: 1. Acute mildly comminuted displaced fracture of the distal third of the ulnar diaphysis. 2. Soft tissue lacerations and puncture wounds to the distal aspect of the right forearm, as above. Electronically Signed   By: Trudie Reedaniel  Entrikin M.D.   On: 07/20/2015 19:52          I have personally reviewed and evaluated these images and lab results as part of my medical decision-making.   EKG Interpretation None      MDM   Final diagnoses:  Fracture, ulna, distal, open, right, type I or II, initial encounter  Laceration of multiple sites of arm, right, initial encounter  Dog bite of right forearm, initial encounter  Dog bite of left thigh, initial encounter    BP 126/83 mmHg  Pulse 79  Resp 18  SpO2 100%   8:12 PM Patient here due to dog bite. This is a pimple and he belongs to her boyfriend. This is a provoked bite. Patient is up-to-date with tetanus. She has multiple ER puncture wound or laceration noted primarily to her right forearm as well as the left anterior thigh. Her deep laceration is involving the musculature and likely involving her radial artery as we are unable to palpate a radial artery and unable to pick it up using that side.. Patient does have ulnar collateral blood flow. Her R hand fingers are cool and mildly dusky. X-ray of right forearm demonstrate acute mildly comminuted displaced fracture of the distal third of the ulnar diaphysis. This is considered to be an open fracture. Dr. Dalene SeltzerSchlossman did consult vascular surgeon and will consult hand specialist for further management of her condition.  Pt is made NPO.  Pain medication given.    8:25 PM Dr. Dalene SeltzerSchlossman have consulted vascular surgeon, Dr. Darrick PennaFields who will be available for consultation.  Will consult hand specialist for further care.  Pt made aware of plan.  Pt's wound will be cleansed and  dressed.  Temporary forearm splint applied for comfort.    9:07 PM Appreciate consultation with hand specialist Dr. Janee Mornhompson who agrees to [facility] of patient's R forearm injury.  Pt received Ancef IV as well as pain med.  Temporary splint applied.  Pt stable for transfer.    9:16 PM Care [facility] with [facility], Dr. Melene Planan Floyd who agrees to [facility]   Fayrene HelperBowie Purnell Daigle, PA-C 07/20/15 2117  Melene Planan Floyd, DO 07/20/15 2304

## 2015-07-20 NOTE — Progress Notes (Signed)
Patient confirms she has [employer] insurance without a pcp.  [facility] list of pcps who accept [employer] insurance within a 25 mile radius of patient's zip code [phone].  Patient thankful for resources.

## 2015-07-21 ENCOUNTER — Emergency Department (HOSPITAL_COMMUNITY): Payer: Managed Care, Other (non HMO) | Admitting: Certified Registered Nurse Anesthetist

## 2015-07-21 ENCOUNTER — Emergency Department (HOSPITAL_COMMUNITY): Payer: Managed Care, Other (non HMO)

## 2015-07-21 DIAGNOSIS — S52601A Unspecified fracture of lower end of right ulna, initial encounter for closed fracture: Secondary | ICD-10-CM | POA: Diagnosis present

## 2015-07-21 MED ORDER — AMOXICILLIN-POT CLAVULANATE 875-125 MG PO TABS: 1.0000 | ORAL_TABLET | Freq: Two times a day (BID) | ORAL | Status: DC

## 2015-07-21 MED ORDER — HYDROMORPHONE HCL 1 MG/ML IJ SOLN: 0.2500 mg | INTRAMUSCULAR | Status: DC | PRN

## 2015-07-21 MED ORDER — DEXAMETHASONE SODIUM PHOSPHATE 10 MG/ML IJ SOLN
INTRAMUSCULAR | Status: DC | PRN
  Administered 2015-07-21: 10 mg via INTRAVENOUS

## 2015-07-21 MED ORDER — SODIUM CHLORIDE 0.9 % IV SOLN
3.0000 g | Freq: Once | INTRAVENOUS | Status: AC
  Administered 2015-07-21: 3 g via INTRAVENOUS
  Filled 2015-07-21: qty 3

## 2015-07-21 MED ORDER — ONDANSETRON HCL 4 MG/2ML IJ SOLN
4.0000 mg | Freq: Four times a day (QID) | INTRAMUSCULAR | Status: DC | PRN
  Administered 2015-07-21: 4 mg via INTRAVENOUS

## 2015-07-21 MED ORDER — PROPOFOL 10 MG/ML IV BOLUS
INTRAVENOUS | Status: DC | PRN
  Administered 2015-07-21: 200 mg via INTRAVENOUS
  Administered 2015-07-21: 50 mg via INTRAVENOUS

## 2015-07-21 MED ORDER — 0.9 % SODIUM CHLORIDE (POUR BTL) OPTIME
TOPICAL | Status: DC | PRN
  Administered 2015-07-21: 1000 mL

## 2015-07-21 MED ORDER — DOCUSATE SODIUM 100 MG PO CAPS: 100.0000 mg | ORAL_CAPSULE | Freq: Two times a day (BID) | ORAL | Status: DC

## 2015-07-21 MED ORDER — FAMOTIDINE 20 MG PO TABS
20.0000 mg | ORAL_TABLET | Freq: Two times a day (BID) | ORAL | Status: DC | PRN
Start: 2015-07-21 — End: 2015-07-21

## 2015-07-21 MED ORDER — LACTATED RINGERS IV SOLN
INTRAVENOUS | Status: DC | PRN
  Administered 2015-07-20 – 2015-07-21 (×2): via INTRAVENOUS

## 2015-07-21 MED ORDER — LIDOCAINE HCL (CARDIAC) 20 MG/ML IV SOLN
INTRAVENOUS | Status: DC | PRN
  Administered 2015-07-21: 100 mg via INTRAVENOUS

## 2015-07-21 MED ORDER — KETOROLAC TROMETHAMINE 30 MG/ML IJ SOLN: 30.0000 mg | Freq: Once | INTRAMUSCULAR | Status: DC | PRN

## 2015-07-21 MED ORDER — SODIUM CHLORIDE 0.9 % IV SOLN
3.0000 g | Freq: Four times a day (QID) | INTRAVENOUS | Status: DC
  Administered 2015-07-21: 3 g via INTRAVENOUS
  Filled 2015-07-21 (×3): qty 3

## 2015-07-21 MED ORDER — DEXAMETHASONE SODIUM PHOSPHATE 10 MG/ML IJ SOLN
INTRAMUSCULAR | Status: AC
  Filled 2015-07-21: qty 1

## 2015-07-21 MED ORDER — SODIUM CHLORIDE 0.9 % IV SOLN
3.0000 g | INTRAVENOUS | Status: AC
  Administered 2015-07-21: 3 g via INTRAVENOUS
  Filled 2015-07-21: qty 3

## 2015-07-21 MED ORDER — MIDAZOLAM HCL 5 MG/5ML IJ SOLN
INTRAMUSCULAR | Status: DC | PRN
  Administered 2015-07-21: 2 mg via INTRAVENOUS

## 2015-07-21 MED ORDER — HYDROCODONE-ACETAMINOPHEN 5-325 MG PO TABS: 1.0000 | ORAL_TABLET | ORAL | Status: DC | PRN

## 2015-07-21 MED ORDER — OXYCODONE-ACETAMINOPHEN 5-325 MG PO TABS: 1.0000 | ORAL_TABLET | ORAL | Status: DC | PRN

## 2015-07-21 MED ORDER — HYDROMORPHONE HCL 1 MG/ML IJ SOLN
0.5000 mg | INTRAMUSCULAR | Status: DC | PRN
  Administered 2015-07-21: 1 mg via INTRAVENOUS
  Filled 2015-07-21: qty 1

## 2015-07-21 MED ORDER — ONDANSETRON HCL 4 MG/2ML IJ SOLN
INTRAMUSCULAR | Status: AC
  Filled 2015-07-21: qty 2

## 2015-07-21 MED ORDER — ONDANSETRON HCL 4 MG/2ML IJ SOLN
INTRAMUSCULAR | Status: DC | PRN
  Administered 2015-07-21: 4 mg via INTRAVENOUS

## 2015-07-21 MED ORDER — SUFENTANIL CITRATE 50 MCG/ML IV SOLN
INTRAVENOUS | Status: DC | PRN
  Administered 2015-07-21 (×2): 10 ug via INTRAVENOUS

## 2015-07-21 MED ORDER — NAPROXEN 500 MG PO TABS: 500.0000 mg | ORAL_TABLET | Freq: Two times a day (BID) | ORAL | Status: DC

## 2015-07-21 MED ORDER — ONDANSETRON HCL 4 MG PO TABS: 4.0000 mg | ORAL_TABLET | Freq: Four times a day (QID) | ORAL | Status: DC | PRN

## 2015-07-21 MED ORDER — NAPROXEN 250 MG PO TABS
500.0000 mg | ORAL_TABLET | Freq: Two times a day (BID) | ORAL | Status: DC
  Administered 2015-07-21: 500 mg via ORAL
  Filled 2015-07-21: qty 2

## 2015-07-21 NOTE — Op Note (Signed)
07/20/2015 - 07/21/2015  2:10 AM  PATIENT:  Maria Chang  25 y.o. female  PRE-OPERATIVE DIAGNOSIS:  Right forearm multiple dog bite wounds with open ulnar fracture and possible radial artery injury  POST-OPERATIVE DIAGNOSIS:  Same--radial artery contusion  PROCEDURE:   1. Exploration of multiple right forearm wounds, with debridement of skin and subcutaneous tissue and muscle.    2.  Exploration of open fracture wound with debridement of skin, subcutaneous tissues, and muscle    3.  ORIF right comminuted ulna fracture    4.  Closure of multiple open wounds, 20 cm  SURGEON: Cliffton Astersavid A. Janee Mornhompson, MD  PHYSICIAN ASSISTANT: None  ANESTHESIA:  general  SPECIMENS:  None  DRAINS:   None  EBL:  less than 50 mL  PREOPERATIVE INDICATIONS:  Maria Chang is a  25 y.o. female with multiple dog bite wounds to the right forearm, with underlying comminuted ulna diaphyseal fracture and possible radial artery injury.   The risks benefits and alternatives were discussed with the patient preoperatively including but not limited to the risks of infection, bleeding, nerve injury, cardiopulmonary complications, the need for revision surgery, among others, and the patient verbalized understanding and consented to proceed.  OPERATIVE IMPLANTS: Biomet small fragment 7-hole LCDC plate and associated locking and nonlocking screws   OPERATIVE PROCEDURE:  After receiving prophylactic antibiotics in the ER, the patient was escorted to the operative theatre and placed in a supine position.   general anesthesia was administeredA surgical "time-out" was performed during which the planned procedure, proposed operative site, and the correct patient identity were compared to the operative consent and agreement confirmed by the circulating nurse according to current facility policy.  Following application of a tourniquet to the operative extremity, the exposed skin was  pre-scrubbed with Hibiclens scrub brush before  being formally prepped withBetadine and draped in the usual sterile fashion.  The limb was exsanguinated with an Esmarch bandage and the tourniquet inflated to approximately 100mmHg higher than systolic BP.  Initially the multiple wounds were systematically sharply debrided at the margins of devitalized tissue.  This included skin, subcutaneous tissue and in certain places fashion muscle.  The larger wounds were explored.  The largest radial wound was explored, the radial artery found to be intact but with a 1-1/2 cm area of contusion.  Ultimately later with the tourniquet up, patency test revealed that there was antegrade flow across the segment.  In addition a superficial radial nerve was inspected and found to be intact.  Ulnarly, the ulnar nerve was found also to be intact with an area of contusion at the level corresponding to the ulnar fracture site.  The open wounds associated with the fracture were also explored and debrided in similar fashion.  All of the wounds were copiously irrigated.  Incision was then made ulnarly, with full-thickness flaps elevated.  The fascia had in some ways been divided, and this was extended to the fracture was exposed.  There were 2 butterfly fragments that were able to be reduced to one another with some degree of interlocking and held with a clamp the distal fragment.  A 7-hole plate was then applied to the distal fragment first.  It was not on the volar surface of the bone, but more the ulnar side as this is what was necessary to help better contain one of the small butterfly fragments.  The fracture was reduced and the plate applied to the proximal ulna with a nonlocking screw, compressing fashion.  The  dynamic compression help to further stabilize the fracture interlocking fragments nicely.  The remainder of the locking holes were then filled with locking screws and final images were obtained.  The reduction was judged to be near-anatomic.  The central hole over the  butterfly fragment was left unfilled.  Tourniquet was released, the wounds again irrigated and through the volar radial wound, the radial artery was again assessed.  At this point it was found there was antegrade flow across the contused segment.  Decision was made to leave the contused segment intact grossly hand was warm and pink and there appeared to maintain flow across it.    The multiple open wounds were then closed loosely with 4-0 Vicryl Rapide interrupted sutures.  The incision for the approach to the ulna was closed with 3-0 nylon sutures.  A bulky forearm dressing with a volar plaster component was applied and she was awakened and taken to the recovery room stable condition, breathing spontaneously  DISPOSITION: She will be transferred to the floor on observation status for additional doses of IV antibiotics and analgesics as required, likely released later today.

## 2015-07-21 NOTE — Discharge Instructions (Signed)
Discharge Instructions ° ° °You have a dressing with a plaster splint incorporated in it. °Move your fingers as much as possible, making a full fist and fully opening the fist. °Elevate your hand to reduce pain & swelling of the digits.  Ice over the operative site may be helpful to reduce pain & swelling.  DO NOT USE HEAT. °Pain medicine has been prescribed for you.  °Use your medicine as needed over the first 48 hours, and then you can begin to taper your use.  You may use Tylenol in place of your prescribed pain medication, but not IN ADDITION to it. °Leave the dressing in place until you return to our office.  °You may shower, but keep the bandage clean & dry.  °You may drive a car when you are off of prescription pain medications and can safely control your vehicle with both hands. °Our office will call you to arrange follow-up ° ° °Please call [phone] during normal business hours or [phone] after hours for any problems. Including the following: ° °- excessive redness of the incisions °- drainage for more than 4 days °- fever of more than 101.5 F ° °*Please note that pain medications will not be refilled after hours or on weekends. ° °

## 2015-07-21 NOTE — Anesthesia Procedure Notes (Signed)
Procedure Name: LMA Insertion Date/Time: 07/21/2015 12:15 AM Performed by: Melina SchoolsBANKS, Lamorris Knoblock J Pre-anesthesia Checklist: Patient identified, Emergency Drugs available, Suction available, Patient being monitored and Timeout performed Patient Re-evaluated:Patient Re-evaluated prior to inductionOxygen Delivery Method: Circle system utilized Preoxygenation: Pre-oxygenation with 100% oxygen Intubation Type: IV induction Ventilation: Mask ventilation without difficulty LMA: LMA inserted LMA Size: 4.0 Number of attempts: 1 Placement Confirmation: positive ETCO2 and breath sounds checked- equal and bilateral Tube secured with: Tape Dental Injury: Teeth and Oropharynx as per pre-operative assessment

## 2015-07-21 NOTE — Transfer of Care (Signed)
Immediate Anesthesia Transfer of Care Note  Patient: Maria Chang  Procedure(s) Performed: Procedure(s): OPEN REDUCTION INTERNAL FIXATION (ORIF) RIGHT ULNAR FRACTURE (Right) IRRIGATION AND DEBRIDEMENT RIGHT ARM (Right)  Patient Location: PACU  Anesthesia Type:General  Level of Consciousness: awake, alert , oriented and patient cooperative  Airway & Oxygen Therapy: Patient Spontanous Breathing and Patient connected to nasal cannula oxygen  Post-op Assessment: Report given to RN, Post -op Vital signs reviewed and stable and Patient moving all extremities X 4  Post vital signs: Reviewed and stable  Last Vitals:  Filed Vitals:   07/20/15 2245 07/20/15 2300  BP: 122/81 116/72  Pulse: 97 80  Temp:    Resp:      Last Pain:  Filed Vitals:   07/21/15 0210  PainSc: 4          Complications: No apparent anesthesia complications

## 2015-07-21 NOTE — Discharge Summary (Signed)
Physician Discharge Summary  Patient ID: Mercer PodSavannah T Delahunt MRN: [id number] DOB/AGE: 04/18/1990 25 y.o.  Admit date: 07/20/2015 Discharge date: 07/21/2015  Admission Diagnoses:  Dog bite wounds R UE with ulna fracture  Discharge Diagnoses:  Active Problems:   Right distal ulnar fracture   Past Medical History  Diagnosis Date  . Substance abuse     previous pain pill addiction  . Headache(784.0)   . Amenorrhea     Surgeries: Procedure(s): OPEN REDUCTION INTERNAL FIXATION (ORIF) RIGHT ULNAR FRACTURE IRRIGATION AND DEBRIDEMENT RIGHT ARM on 07/20/2015 - 07/21/2015   Consultants (if any):    Discharged Condition: Improved  Hospital Course: Mercer PodSavannah T Nuzum is an 25 y.o. female who was admitted 07/20/2015 with a diagnosis stated above and went to the operating room on 07/20/2015 - 07/21/2015 and underwent the above named procedures. She was kept in observation for some additional IV antibiotics and pain control and then released   She was given perioperative antibiotics:  Anti-infectives    Start     Dose/Rate Route Frequency Ordered Stop   07/21/15 0800  Ampicillin-Sulbactam (UNASYN) 3 g in sodium chloride 0.9 % 100 mL IVPB     3 g 100 mL/hr over 60 Minutes Intravenous Every 6 hours 07/21/15 0307 07/22/15 0159   07/21/15 0130  Ampicillin-Sulbactam (UNASYN) 3 g in sodium chloride 0.9 % 100 mL IVPB     3 g 100 mL/hr over 60 Minutes Intravenous To Surgery 07/21/15 0120 07/21/15 0208   07/21/15 0000  amoxicillin-clavulanate (AUGMENTIN) 875-125 MG tablet     1 tablet Oral 2 times daily 07/21/15 1116     07/20/15 2015  ceFAZolin (ANCEF) IVPB 1 g/50 mL premix     1 g 100 mL/hr over 30 Minutes Intravenous  Once 07/20/15 2012 07/20/15 2106    .  She was given sequential compression devices, early ambulation,  for DVT prophylaxis.  She benefited maximally from the hospital stay and there were no complications.    Recent vital signs:  Filed Vitals:   07/21/15 0300 07/21/15 0500  BP:  155/79 155/74  Pulse: 101 99  Temp: 97.5 F (36.4 C) 97.5 F (36.4 C)  Resp: 19 20    Recent laboratory studies:  No results found for: HGB No results found for: WBC, PLT No results found for: INR No results found for: NA, K, CL, CO2, BUN, CREATININE, GLUCOSE  Discharge Medications:     Medication List    TAKE these medications        amoxicillin-clavulanate 875-125 MG tablet  Commonly known as:  AUGMENTIN  Take 1 tablet by mouth 2 (two) times daily.     medroxyPROGESTERone 150 MG/ML injection  Commonly known as:  DEPO-PROVERA  Inject 1 mL (150 mg total) into the muscle every 3 (three) months.     naproxen 500 MG tablet  Commonly known as:  NAPROSYN  Take 1 tablet (500 mg total) by mouth 2 (two) times daily with a meal.     oxyCODONE-acetaminophen 5-325 MG tablet  Commonly known as:  ROXICET  Take 1-2 tablets by mouth every 4 (four) hours as needed.        Diagnostic Studies: Dg Forearm Right  07/20/2015  CLINICAL DATA:  25 year old female attacked by a pit bull. Laceration on the anterior aspect of the right forearm. Numbness in the hand. EXAM: RIGHT FOREARM - 2 VIEW COMPARISON:  No priors. FINDINGS: Soft tissues over the distal aspect of the form are regular, suggestive of multiple soft tissue lacerations  and puncture wounds. No retained radiopaque foreign body in the soft tissues is identified. The radius appears intact. The distal third of the ulnar diaphysis is fractured. This fracture is mildly comminuted and displaced approximately 1 shaft width anteriorly. Minimal to no angulation. IMPRESSION: 1. Acute mildly comminuted displaced fracture of the distal third of the ulnar diaphysis. 2. Soft tissue lacerations and puncture wounds to the distal aspect of the right forearm, as above. Electronically Signed   By: Trudie Reed M.D.   On: 07/20/2015 19:52   Dg Wrist 2 Views Right  07/21/2015  CLINICAL DATA:  25 year old female with history of ulnar fracture from a dog  bite. Status post ORIF. EXAM: RIGHT WRIST - 2 VIEW; DG C-ARM 1-60 MIN-NO REPORT COMPARISON:  07/20/2015. FINDINGS: Two intraoperative fluoroscopic spot films of the right wrist and distal right form demonstrate interval placement of a lateral plate and screw fixation device traversing the previously demonstrated mildly comminuted fracture of the distal third of the ulnar diaphysis. Anatomic alignment has been restored. Overlying soft tissues remain irregular, indicative of lacerations and puncture wounds from the dog bite. IMPRESSION: 1. ORIF of distal ulnar fracture, as above. Electronically Signed   By: Trudie Reed M.D.   On: 07/21/2015 10:37   Dg C-arm 1-60 Min-no Report  07/21/2015  CLINICAL DATA: Surgery C-ARM 1-60 MINUTES Fluoroscopy was utilized by the requesting physician.  No radiographic interpretation.    Disposition: 01-Home or Self Care        Follow-up Information    Follow up with Jodi Marble., MD.   Specialty:  Orthopedic Surgery   Why:  THIS MONDAY AT 12:30 FOR AN IMPORTANT WOUND CHECK   Contact information:   [address] [place] [address] [phone]        Signed: Seynabou Fults A. 07/21/2015, 11:23 AM

## 2015-07-21 NOTE — Anesthesia Postprocedure Evaluation (Signed)
Anesthesia Post Note  Patient: Maria Chang  Procedure(s) Performed: Procedure(s) (LRB): OPEN REDUCTION INTERNAL FIXATION (ORIF) RIGHT ULNAR FRACTURE (Right) IRRIGATION AND DEBRIDEMENT RIGHT ARM (Right)  Patient location during evaluation: PACU Anesthesia Type: General Level of consciousness: awake and alert Pain management: pain level controlled Vital Signs Assessment: post-procedure vital signs reviewed and stable Respiratory status: spontaneous breathing, nonlabored ventilation, respiratory function stable and patient connected to nasal cannula oxygen Cardiovascular status: blood pressure returned to baseline and stable Postop Assessment: no signs of nausea or vomiting Anesthetic complications: no    Last Vitals:  Filed Vitals:   07/21/15 0206 07/21/15 0221  BP: 108/58 126/78  Pulse: 80 103  Temp: 36.3 C 36.4 C  Resp: 16 19    Last Pain:  Filed Vitals:   07/21/15 0224  PainSc: 4                  Manisha Cancel S

## 2015-07-23 ENCOUNTER — Encounter (HOSPITAL_COMMUNITY): Payer: Self-pay | Admitting: Orthopedic Surgery

## 2015-08-07 ENCOUNTER — Ambulatory Visit (INDEPENDENT_AMBULATORY_CARE_PROVIDER_SITE_OTHER): Payer: Managed Care, Other (non HMO) | Admitting: *Deleted

## 2015-08-07 VITALS — BP 102/60 | HR 72 | Resp 16 | Ht 63.25 in | Wt 156.0 lb

## 2015-08-07 DIAGNOSIS — Z3042 Encounter for surveillance of injectable contraceptive: Secondary | ICD-10-CM | POA: Diagnosis not present

## 2015-08-07 MED ORDER — MEDROXYPROGESTERONE ACETATE 150 MG/ML IM SUSP
150.0000 mg | Freq: Once | INTRAMUSCULAR | Status: AC
  Administered 2015-08-07: 150 mg via INTRAMUSCULAR

## 2015-08-07 NOTE — Progress Notes (Signed)
Patient is here for Depo Provera Injection Patient is within Depo Provera Calender Limits yes, 05/22/15  Next Depo Due between: 8/15-8/29 Last AEX: 10/20/14 DL AEX Scheduled: 9/60/458/17/17  Patient is aware when next depo is due  Pt tolerated Injection well.  Routed to provider for review, encounter closed.

## 2015-08-08 DIAGNOSIS — M25431 Effusion, right wrist: Secondary | ICD-10-CM | POA: Insufficient documentation

## 2015-08-08 DIAGNOSIS — M25531 Pain in right wrist: Secondary | ICD-10-CM

## 2015-08-08 DIAGNOSIS — M2560 Stiffness of unspecified joint, not elsewhere classified: Secondary | ICD-10-CM | POA: Insufficient documentation

## 2015-10-23 ENCOUNTER — Ambulatory Visit: Payer: Managed Care, Other (non HMO)

## 2015-10-25 ENCOUNTER — Ambulatory Visit: Payer: BLUE CROSS/BLUE SHIELD | Admitting: Certified Nurse Midwife

## 2015-10-30 ENCOUNTER — Encounter: Payer: Self-pay | Admitting: Certified Nurse Midwife

## 2015-10-30 ENCOUNTER — Ambulatory Visit: Payer: Managed Care, Other (non HMO) | Admitting: Certified Nurse Midwife

## 2015-10-30 VITALS — BP 108/66 | HR 62 | Resp 16 | Ht 63.0 in | Wt 147.0 lb

## 2015-10-30 DIAGNOSIS — Z3042 Encounter for surveillance of injectable contraceptive: Secondary | ICD-10-CM

## 2015-10-30 DIAGNOSIS — Z124 Encounter for screening for malignant neoplasm of cervix: Secondary | ICD-10-CM | POA: Diagnosis not present

## 2015-10-30 DIAGNOSIS — Z01419 Encounter for gynecological examination (general) (routine) without abnormal findings: Secondary | ICD-10-CM | POA: Diagnosis not present

## 2015-10-30 MED ORDER — MEDROXYPROGESTERONE ACETATE 150 MG/ML IM SUSP: 150.0000 mg | INTRAMUSCULAR | 4 refills | Status: DC

## 2015-10-30 MED ORDER — MEDROXYPROGESTERONE ACETATE 150 MG/ML IM SUSP
150.0000 mg | Freq: Once | INTRAMUSCULAR | Status: AC
  Administered 2015-10-30: 150 mg via INTRAMUSCULAR

## 2015-10-30 NOTE — Patient Instructions (Signed)
General topics  Next pap or exam is  due in 1 year Take a Women's multivitamin Take 1200 mg. of calcium daily - prefer dietary If any concerns in interim to call back  Breast Self-Awareness Practicing breast self-awareness may pick up problems early, prevent significant medical complications, and possibly save your life. By practicing breast self-awareness, you can become familiar with how your breasts look and feel and if your breasts are changing. This allows you to notice changes early. It can also offer you some reassurance that your breast health is good. One way to learn what is normal for your breasts and whether your breasts are changing is to do a breast self-exam. If you find a lump or something that was not present in the past, it is best to contact your caregiver right away. Other findings that should be evaluated by your caregiver include nipple discharge, especially if it is bloody; skin changes or reddening; areas where the skin seems to be pulled in (retracted); or new lumps and bumps. Breast pain is seldom associated with cancer (malignancy), but should also be evaluated by a caregiver. BREAST SELF-EXAM The best time to examine your breasts is 5 7 days after your menstrual period is over.  ExitCare Patient Information 2013 [place].   Exercise to Stay Healthy Exercise helps you become and stay healthy. EXERCISE IDEAS AND TIPS Choose exercises that:  You enjoy.  Fit into your day. You do not need to exercise really hard to be healthy. You can do exercises at a slow or medium level and stay healthy. You can:  Stretch before and after working out.  Try yoga, Pilates, or tai chi.  Lift weights.  Walk fast, swim, jog, run, climb stairs, bicycle, dance, or rollerskate.  Take aerobic classes. Exercises that burn about 150 calories:  Running 1  miles in 15 minutes.  Playing volleyball for 45 to 60 minutes.  Washing and waxing a car for 45 to 60  minutes.  Playing touch football for 45 minutes.  Walking 1  miles in 35 minutes.  Pushing a stroller 1  miles in 30 minutes.  Playing basketball for 30 minutes.  Raking leaves for 30 minutes.  Bicycling 5 miles in 30 minutes.  Walking 2 miles in 30 minutes.  Dancing for 30 minutes.  Shoveling snow for 15 minutes.  Swimming laps for 20 minutes.  Walking up stairs for 15 minutes.  Bicycling 4 miles in 15 minutes.  Gardening for 30 to 45 minutes.  Jumping rope for 15 minutes.  Washing windows or floors for 45 to 60 minutes. Document Released: 03/29/2010 Document Revised: 05/19/2011 Document Reviewed: 03/29/2010 [facility] Patient Information 2013 [place].   Other topics ( that may be useful information):    Sexually Transmitted Disease Sexually transmitted disease (STD) refers to any infection that is passed from person to person during sexual activity. This may happen by way of saliva, semen, blood, vaginal mucus, or urine. Common STDs include:  Gonorrhea.  Chlamydia.  Syphilis.  HIV/AIDS.  Genital herpes.  Hepatitis B and C.  Trichomonas.  Human papillomavirus (HPV).  Pubic lice. CAUSES  An STD may be spread by bacteria, virus, or parasite. A person can get an STD by:  Sexual intercourse with an infected person.  Sharing sex toys with an infected person.  Sharing needles with an infected person.  Having intimate contact with the genitals, mouth, or rectal areas of an infected person. SYMPTOMS  Some people may not have any symptoms, but  they can still pass the infection to others. Different STDs have different symptoms. Symptoms include:  Painful or bloody urination.  Pain in the pelvis, abdomen, vagina, anus, throat, or eyes.  Skin rash, itching, irritation, growths, or sores (lesions). These usually occur in the genital or anal area.  Abnormal vaginal discharge.  Penile discharge in men.  Soft, flesh-colored skin growths in the  genital or anal area.  Fever.  Pain or bleeding during sexual intercourse.  Swollen glands in the groin area.  Yellow skin and eyes (jaundice). This is seen with hepatitis. DIAGNOSIS  To make a diagnosis, your caregiver may:  Take a medical history.  Perform a physical exam.  Take a specimen (culture) to be examined.  Examine a sample of discharge under a microscope.  Perform blood test TREATMENT   Chlamydia, gonorrhea, trichomonas, and syphilis can be cured with antibiotic medicine.  Genital herpes, hepatitis, and HIV can be treated, but not cured, with prescribed medicines. The medicines will lessen the symptoms.  Genital warts from HPV can be treated with medicine or by freezing, burning (electrocautery), or surgery. Warts may come back.  HPV is a virus and cannot be cured with medicine or surgery.However, abnormal areas may be followed very closely by your caregiver and may be removed from the cervix, vagina, or vulva through office procedures or surgery. If your diagnosis is confirmed, your recent sexual partners need treatment. This is true even if they are symptom-free or have a negative culture or evaluation. They should not have sex until their caregiver says it is okay. HOME CARE INSTRUCTIONS  All sexual partners should be informed, tested, and treated for all STDs.  Take your antibiotics as directed. Finish them even if you start to feel better.  Only take over-the-counter or prescription medicines for pain, discomfort, or fever as directed by your caregiver.  Rest.  Eat a balanced diet and drink enough fluids to keep your urine clear or pale yellow.  Do not have sex until treatment is completed and you have followed up with your caregiver. STDs should be checked after treatment.  Keep all follow-up appointments, Pap tests, and blood tests as directed by your caregiver.  Only use latex condoms and water-soluble lubricants during sexual activity. Do not use  petroleum jelly or oils.  Avoid alcohol and illegal drugs.  Get vaccinated for HPV and hepatitis. If you have not received these vaccines in the past, talk to your caregiver about whether one or both might be right for you.  Avoid risky sex practices that can break the skin. The only way to avoid getting an STD is to avoid all sexual activity.Latex condoms and dental dams (for oral sex) will help lessen the risk of getting an STD, but will not completely eliminate the risk. SEEK MEDICAL CARE IF:   You have a fever.  You have any new or worsening symptoms. Document Released: 05/17/2002 Document Revised: 05/19/2011 Document Reviewed: 05/24/2010 [facility] Patient Information 2013 [place].    Domestic Abuse You are being battered or abused if someone close to you hits, pushes, or physically hurts you in any way. You also are being abused if you are forced into activities. You are being sexually abused if you are forced to have sexual contact of any kind. You are being emotionally abused if you are made to feel worthless or if you are constantly threatened. It is important to remember that help is available. No one has the right to abuse you. PREVENTION OF FURTHER  ABUSE  Learn the warning signs of danger. This varies with situations but may include: the use of alcohol, threats, isolation from friends and family, or forced sexual contact. Leave if you feel that violence is going to occur.  If you are attacked or beaten, report it to the police so the abuse is documented. You do not have to press charges. The police can protect you while you or the attackers are leaving. Get the officer's name and badge number and a copy of the report.  Find someone you can trust and tell them what is happening to you: your caregiver, a nurse, clergy member, close friend or family member. Feeling ashamed is natural, but remember that you have done nothing wrong. No one deserves abuse. Document Released:  02/22/2000 Document Revised: 05/19/2011 Document Reviewed: 05/02/2010 [employer] Patient Information 2013 [employer].    How Much is Too Much Alcohol? Drinking too much alcohol can cause injury, accidents, and health problems. These types of problems can include:   Car crashes.  Falls.  Family fighting (domestic violence).  Drowning.  Fights.  Injuries.  Burns.  Damage to certain organs.  Having a baby with birth defects. ONE DRINK CAN BE TOO MUCH WHEN YOU ARE:  Working.  Pregnant or breastfeeding.  Taking medicines. Ask your doctor.  Driving or planning to drive. If you or someone you know has a drinking problem, get help from a doctor.  Document Released: 12/21/2008 Document Revised: 05/19/2011 Document Reviewed: 12/21/2008 [employer] Patient Information 2013 [employer].   Smoking Hazards Smoking cigarettes is extremely bad for your health. Tobacco smoke has over 200 known poisons in it. There are over 60 chemicals in tobacco smoke that cause cancer. Some of the chemicals found in cigarette smoke include:   Cyanide.  Benzene.  Formaldehyde.  Methanol (wood alcohol).  Acetylene (fuel used in welding torches).  Ammonia. Cigarette smoke also contains the poisonous gases nitrogen oxide and carbon monoxide.  Cigarette smokers have an increased risk of many serious medical problems and Smoking causes approximately:  90% of all lung cancer deaths in men.  80% of all lung cancer deaths in women.  90% of deaths from chronic obstructive lung disease. Compared with nonsmokers, smoking increases the risk of:  Coronary heart disease by 2 to 4 times.  Stroke by 2 to 4 times.  Men developing lung cancer by 23 times.  Women developing lung cancer by 13 times.  Dying from chronic obstructive lung diseases by 12 times.  . Smoking is the most preventable cause of death and disease in our society.  WHY IS SMOKING ADDICTIVE?  Nicotine is the chemical  agent in tobacco that is capable of causing addiction or dependence.  When you smoke and inhale, nicotine is absorbed rapidly into the bloodstream through your lungs. Nicotine absorbed through the lungs is capable of creating a powerful addiction. Both inhaled and non-inhaled nicotine may be addictive.  Addiction studies of cigarettes and spit tobacco show that addiction to nicotine occurs mainly during the teen years, when young people begin using tobacco products. WHAT ARE THE BENEFITS OF QUITTING?  There are many health benefits to quitting smoking.   Likelihood of developing cancer and heart disease decreases. Health improvements are seen almost immediately.  Blood pressure, pulse rate, and breathing patterns start returning to normal soon after quitting. QUITTING SMOKING   American Lung Association - 1-800-LUNGUSA  American Cancer Society - 1-800-ACS-2345 Document Released: 04/03/2004 Document Revised: 05/19/2011 Document Reviewed: 12/06/2008 ExitCare Patient Information 2013 [employer].   Stress Management Stress is a state of physical or mental tension that often results from changes in your life or normal routine. Some common causes of stress are:  Death of a loved one.  Injuries or severe illnesses.  Getting fired or changing jobs.  Moving into a new home. Other causes may be:  Sexual problems.  Business or financial losses.  Taking on a large debt.  Regular conflict with someone at home or at work.  Constant tiredness from lack of sleep. It is not just bad things that are stressful. It may be stressful to:  Win the lottery.  Get married.  Buy a new car. The amount of stress that can be easily tolerated varies from person to person. Changes generally cause stress, regardless of the types of change. Too much stress can affect your health. It may lead to physical or emotional problems. Too little stress (boredom) may also become stressful. SUGGESTIONS TO  REDUCE STRESS:  Talk things over with your family and friends. It often is helpful to share your concerns and worries. If you feel your problem is serious, you may want to get help from a professional counselor.  Consider your problems one at a time instead of lumping them all together. Trying to take care of everything at once may seem impossible. List all the things you need to do and then start with the most important one. Set a goal to accomplish 2 or 3 things each day. If you expect to do too many in a single day you will naturally fail, causing you to feel even more stressed.  Do not use alcohol or drugs to relieve stress. Although you may feel better for a short time, they do not remove the problems that caused the stress. They can also be habit forming.  Exercise regularly - at least 3 times per week. Physical exercise can help to relieve that "uptight" feeling and will relax you.  The shortest distance between despair and hope is often a good night's sleep.  Go to bed and get up on time allowing yourself time for appointments without being rushed.  Take a short "time-out" period from any stressful situation that occurs during the day. Close your eyes and take some deep breaths. Starting with the muscles in your face, tense them, hold it for a few seconds, then relax. Repeat this with the muscles in your neck, shoulders, hand, stomach, back and legs.  Take good care of yourself. Eat a balanced diet and get plenty of rest.  Schedule time for having fun. Take a break from your daily routine to relax. HOME CARE INSTRUCTIONS   Call if you feel overwhelmed by your problems and feel you can no longer manage them on your own.  Return immediately if you feel like hurting yourself or someone else. Document Released: 08/20/2000 Document Revised: 05/19/2011 Document Reviewed: 04/12/2007 [facility] Patient Information 2013 [place].

## 2015-10-30 NOTE — Progress Notes (Signed)
25 y.o. G0P0000 Single  Caucasian Fe here for annual exam. No periods with Depo Provera. NO weight gain, actually has lost 4 pounds. No partner change, no STD screening desired.. No health issues today. Saw the eclipse!  No LMP recorded. Patient has had an injection.          Sexually active: Yes.    The current method of family planning is Depo-Provera injections.    Exercising: No.  The patient does not participate in regular exercise at present. Smoker:  Former   Health Maintenance: Pap:  10/20/14 ASCUS. HR HPV:+Detected.  MMG:  None Self Breast Exam: Yes, every 2-3 weeks  TDaP:  04/2014  Gardasil: Completed  Hep C and HIV: Done:Neg  Labs: Not today    reports that she has quit smoking. Her smoking use included Cigarettes. She has never used smokeless tobacco. She reports that she drinks alcohol. She reports that she uses drugs, including Marijuana.  Past Medical History:  Diagnosis Date  . Amenorrhea   . Headache(784.0)   . Right distal ulnar fracture   . Substance abuse    previous pain pill addiction    Past Surgical History:  Procedure Laterality Date  . GUM SURGERY     age 25  . I&D EXTREMITY Right 07/20/2015   Procedure: IRRIGATION AND DEBRIDEMENT RIGHT ARM;  Surgeon: Mack Hookavid Thompson, MD;  [facility];  Service: Orthopedics;  Laterality: Right;  . ORIF ULNAR FRACTURE Right 07/20/2015   Procedure: OPEN REDUCTION INTERNAL FIXATION (ORIF) RIGHT ULNAR FRACTURE;  Surgeon: Mack Hookavid Thompson, MD;  [facility];  Service: Orthopedics;  Laterality: Right;    Current Outpatient Prescriptions  Medication Sig Dispense Refill  . medroxyPROGESTERone (DEPO-PROVERA) 150 MG/ML injection Inject 1 mL (150 mg total) into the muscle every 3 (three) months. 1 mL 4   No current facility-administered medications for this visit.     Family History  Problem Relation Age of Onset  . Aneurysm Mother   . Diabetes Paternal Grandmother   . Glaucoma Paternal Grandmother     ROS:   Pertinent items are noted in HPI.  Otherwise, a comprehensive ROS was negative.  Exam:   BP 108/66 (BP Location: Right Arm, Patient Position: Sitting, Cuff Size: Normal)   Pulse 62   Resp 16   Ht 5\' 3"  (1.6 m)   Wt 147 lb (66.7 kg)   BMI 26.04 kg/m  Height: 5\' 3"  (160 cm) Ht Readings from Last 3 Encounters:  10/30/15 5\' 3"  (1.6 m)  08/07/15 5' 3.25" (1.607 m)  05/22/15 5' 3.25" (1.607 m)    General appearance: alert, cooperative and appears stated age Head: Normocephalic, without obvious abnormality, atraumatic Neck: no adenopathy, supple, symmetrical, trachea midline and thyroid normal to inspection and palpation Lungs: clear to auscultation bilaterally Breasts: normal appearance, no masses or tenderness, No nipple retraction or dimpling, No nipple discharge or bleeding, No axillary or supraclavicular adenopathy Heart: regular rate and rhythm Abdomen: soft, non-tender; no masses,  no organomegaly Extremities: extremities normal, atraumatic, no cyanosis or edema Skin: Skin color, texture, turgor normal. No rashes or lesions Lymph nodes: Cervical, supraclavicular, and axillary nodes normal. No abnormal inguinal nodes palpated Neurologic: Grossly normal   Pelvic: External genitalia:  no lesions              Urethra:  normal appearing urethra with no masses, tenderness or lesions              Bartholin's and Skene's: normal  Vagina: normal appearing vagina with normal color and discharge, no lesions              Cervix: no bleeding following Pap, no cervical motion tenderness, no lesions and nulliparous appearance              Pap taken: Yes.   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 Risks and benefits reviewed and warning signs.  If negative repeat in one year, if not per results.  Pap smear as above with HPV reflex    Reviewed SBE, diet and exercise. STD prevention.  return annually or prn  An After Visit Summary was printed and given to the patient.

## 2015-10-30 NOTE — Progress Notes (Signed)
Patient is here for Depo Provera Injection Patient is within Depo Provera Calender Limits yes, last Depo 08/07/15 Next Depo Due between: 11/7 - 11/21 Last AEX: today AEX Scheduled: not sheduled  Patient is aware when next depo is due  Pt tolerated Injection well.

## 2015-11-01 NOTE — Progress Notes (Signed)
Encounter reviewed Jill Jertson, MD   

## 2015-11-02 ENCOUNTER — Other Ambulatory Visit: Payer: Self-pay | Admitting: Certified Nurse Midwife

## 2015-11-02 ENCOUNTER — Telehealth: Payer: Self-pay | Admitting: *Deleted

## 2015-11-02 DIAGNOSIS — R87613 High grade squamous intraepithelial lesion on cytologic smear of cervix (HGSIL): Secondary | ICD-10-CM

## 2015-11-02 LAB — IPS PAP TEST WITH REFLEX TO HPV

## 2015-11-02 NOTE — Telephone Encounter (Signed)
Spoke with patient. Advised of message as seen below from PepsiCoDeborah Leonard CNM. She is agreeable and verbalizes understanding. Patient is on Depo Provera for contraception. Last Depo was given on time on 10/30/2015 (please see OV note). Colposcopy scheduled for 11/06/2015 at 3 pm with Dr.Miller. Patient is agreeable to date and time.  Instructions given. Motrin 800 mg po x , one hour before appointment with food. Make sure to eat a meal before appointment and drink plenty of fluids. Patient verbalized understanding and will call to reschedule if will be on menses or has any concerns regarding pregnancy. Patient agreeable and verbalized understanding of all instructions.   Cc: Dr.Miller  Routing to provider for final review. Patient agreeable to disposition. Will close encounter.

## 2015-11-02 NOTE — Telephone Encounter (Signed)
-----   Message from Verner Choleborah S Leonard, CNM sent at 11/02/2015  1:09 PM EDT ----- Notify patient that pap smear is abnormal with HSIL and needs colposcopy evaluation .Order placed. Needs scheduling with MD.

## 2015-11-02 NOTE — Telephone Encounter (Signed)
Call to patient, left message to call back, speak to triage nurse.

## 2015-11-05 ENCOUNTER — Encounter: Payer: Self-pay | Admitting: Obstetrics & Gynecology

## 2015-11-05 ENCOUNTER — Telehealth: Payer: Self-pay | Admitting: Obstetrics & Gynecology

## 2015-11-05 NOTE — Telephone Encounter (Signed)
Call to patient to rescheduled appointment. Left message to call back.

## 2015-11-05 NOTE — Telephone Encounter (Signed)
Patient called and cancelled her appointment for a colposcopy on Tuesday, 11/06/15 due to work. Please call her to reschedule.

## 2015-11-06 ENCOUNTER — Ambulatory Visit: Payer: Managed Care, Other (non HMO) | Admitting: Obstetrics & Gynecology

## 2015-11-06 NOTE — Telephone Encounter (Signed)
Patient returned call. Colpo rescheduled for 11-13-15 per patient request due to work schedule. She is aware appointment will be with Dr Oscar LaJertson and she is agreeable. This is best day for her schedule. She is aware to take Motrin 800 mg one hour prior with food. She is aware of cancellation policy.  Routing to provider for final review. Patient agreeable to disposition. Will close encounter.

## 2015-11-13 ENCOUNTER — Ambulatory Visit (INDEPENDENT_AMBULATORY_CARE_PROVIDER_SITE_OTHER): Payer: Managed Care, Other (non HMO) | Admitting: Obstetrics and Gynecology

## 2015-11-13 ENCOUNTER — Encounter: Payer: Self-pay | Admitting: Obstetrics and Gynecology

## 2015-11-13 VITALS — BP 138/80 | HR 80 | Resp 13 | Wt 148.0 lb

## 2015-11-13 DIAGNOSIS — R87613 High grade squamous intraepithelial lesion on cytologic smear of cervix (HGSIL): Secondary | ICD-10-CM

## 2015-11-13 DIAGNOSIS — Z01812 Encounter for preprocedural laboratory examination: Secondary | ICD-10-CM

## 2015-11-13 LAB — POCT URINE PREGNANCY: PREG TEST UR: NEGATIVE

## 2015-11-13 NOTE — Progress Notes (Signed)
GYNECOLOGY  VISIT   HPI: 25 y.o.   Single  Caucasian  female   G0P0000 with No LMP recorded. Patient has had an injection.   here for colposcopy for an abnormal pap. Pap returned with HSIL  GYNECOLOGIC HISTORY: No LMP recorded. Patient has had an injection. Contraception:Depo Provera  Menopausal hormone therapy: none         OB History    Gravida Para Term Preterm AB Living   0 0 0 0 0 0   SAB TAB Ectopic Multiple Live Births   0 0 0 0           Patient Active Problem List   Diagnosis Date Noted  . Right distal ulnar fracture 07/21/2015  . History of chlamydia infection 08/18/2012  . History of substance abuse 07/08/2012    Past Medical History:  Diagnosis Date  . Amenorrhea   . Headache(784.0)   . Right distal ulnar fracture 07/20/2015  . Substance abuse    previous pain pill addiction    Past Surgical History:  Procedure Laterality Date  . GUM SURGERY     age 25  . I&D EXTREMITY Right 07/20/2015   Procedure: IRRIGATION AND DEBRIDEMENT RIGHT ARM;  Surgeon: Mack Hookavid Thompson, MD;  [facility];  Service: Orthopedics;  Laterality: Right;  . ORIF ULNAR FRACTURE Right 07/20/2015   Procedure: OPEN REDUCTION INTERNAL FIXATION (ORIF) RIGHT ULNAR FRACTURE;  Surgeon: Mack Hookavid Thompson, MD;  [facility];  Service: Orthopedics;  Laterality: Right;    Current Outpatient Prescriptions  Medication Sig Dispense Refill  . medroxyPROGESTERone (DEPO-PROVERA) 150 MG/ML injection Inject 1 mL (150 mg total) into the muscle every 3 (three) months. 1 mL 4   No current facility-administered medications for this visit.      ALLERGIES: Zithromax [azithromycin]  Family History  Problem Relation Age of Onset  . Aneurysm Mother   . Diabetes Paternal Grandmother   . Glaucoma Paternal Grandmother     Social History   Social History  . Marital status: Single    Spouse name: N/A  . Number of children: N/A  . Years of education: N/A   Occupational History  . Not on file.    Social History Main Topics  . Smoking status: Former Smoker    Types: Cigarettes  . Smokeless tobacco: Never Used     Comment: 4  day  . Alcohol use Yes     Comment: occ  . Drug use:     Types: Marijuana     Comment: once every other day  . Sexual activity: Yes    Partners: Male    Birth control/ protection: Injection     Comment: depo injection   Other Topics Concern  . Not on file   Social History Narrative  . No narrative on file    Review of Systems  Constitutional: Negative.   HENT: Negative.   Eyes: Negative.   Respiratory: Negative.   Cardiovascular: Negative.   Gastrointestinal: Negative.   Genitourinary: Negative.   Musculoskeletal: Negative.   Skin: Negative.   Neurological: Negative.   Endo/Heme/Allergies: Negative.   Psychiatric/Behavioral: Negative.     PHYSICAL EXAMINATION:    BP 138/80 (BP Location: Right Arm, Patient Position: Sitting, Cuff Size: Normal)   Pulse 80   Resp 13   Wt 148 lb (67.1 kg)   BMI 26.22 kg/m     General appearance: alert, cooperative and appears stated age  Pelvic: External genitalia:  no lesions  Urethra:  normal appearing urethra with no masses, tenderness or lesions              Bartholins and Skenes: normal                 Vagina: normal appearing vagina with normal color and discharge, no lesions              Cervix:no gross lesions  Colposcopy: Satisfactory, aceto-white changes anteriorly and posteriorly, mild mosaics. Biopsies taken at 1 and 5 o'clock on the cervix. ECC done.  Negative lugols of the vagina.  Hemostasis maintained with silver nitrate and monsels  Chaperone was present for exam.  ASSESSMENT HSIL pap    PLAN Colposcopy with biopsies and ECC done Discussed possibility of f/u or treatment. Discussed at her age, women are better at clearing the virus then older women Further plan after results return   An After Visit Summary was printed and given to the patient.

## 2015-11-13 NOTE — Patient Instructions (Signed)

## 2015-11-16 ENCOUNTER — Telehealth: Payer: Self-pay | Admitting: *Deleted

## 2015-11-16 NOTE — Telephone Encounter (Signed)
Patient is returning a call to FincastleSally. She states she will try to call back around 4pm when she gets off work.

## 2015-11-16 NOTE — Telephone Encounter (Signed)
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 Given her young age her options are very close f/u with a repeat colposcopy and pap every 6 months, or LEEP. The preferred treatment with CIN II is f/u and with CIN III is LEEP. Her risk of developing cervical cancer is about 0.5% in 1 year without treatment. Her risk with treatment is a small risk of a pre-term delivery in a future pregnancy.  See if she would like to come in to further discuss her options. With the CIN III I would lean towards LEEP.

## 2015-11-16 NOTE — Telephone Encounter (Signed)
Call to patient. Left message to call back. Speak to Quest DiagnosticsSally or Kaitlyn.

## 2015-11-16 NOTE — Telephone Encounter (Signed)
Call from patient. Results given to patient as directed by Dr Oscar LaJertson. Brief explanation of LEEP procedure. Brief discussion of patient comfort level between monitoring versus treatment. Patient will consider all the information and call back with decision or make appointment to discuss further with Dr Oscar LaJertson.  Routing to provider for final review. Patient agreeable to disposition. Will close encounter.

## 2015-12-06 ENCOUNTER — Telehealth: Payer: Self-pay | Admitting: Certified Nurse Midwife

## 2015-12-06 DIAGNOSIS — D069 Carcinoma in situ of cervix, unspecified: Secondary | ICD-10-CM

## 2015-12-06 DIAGNOSIS — N870 Mild cervical dysplasia: Secondary | ICD-10-CM

## 2015-12-06 DIAGNOSIS — N871 Moderate cervical dysplasia: Secondary | ICD-10-CM

## 2015-12-06 NOTE — Telephone Encounter (Signed)
Patient calling to schedule a LEEP procedure.

## 2015-12-06 NOTE — Telephone Encounter (Signed)
Spoke with patient. Patient states that she is ready to schedule her LEEP procedure. Patient is currently using Depo Provera for OCP. Last injection was on 10/30/2015. Next injection is due between 11/7-11/21. Patient reports she does not have cycles with Depo. Patient is requesting to schedule for November 21st after 9:30 am. Declines earlier appointment due to her school schedule. Appointment scheduled for 01/29/2016 at 11 am with Dr.Jertson. Patient is agreeable to date and time.   Instructions given. Motrin 800 mg po x , one hour before appointment with food. Make sure to eat a meal before appointment and drink plenty of fluids. Patient verbalized understanding and will call to reschedule if will be on menses or has any concerns regarding pregnancy. Advised of importance of keeping her appointment for her next depo injection on 01/15/2016 so that she is current with birth control for her procedure. Patient is agreeable and verbalizes understanding. Order placed for precert.  Routing to Dr.Jertson for review before closing.

## 2016-01-14 ENCOUNTER — Telehealth: Payer: Self-pay | Admitting: Obstetrics and Gynecology

## 2016-01-14 NOTE — Telephone Encounter (Signed)
Called patient to review benefits for procedure. Left voicemail to call back and review. °

## 2016-01-15 ENCOUNTER — Ambulatory Visit (INDEPENDENT_AMBULATORY_CARE_PROVIDER_SITE_OTHER): Payer: Managed Care, Other (non HMO) | Admitting: *Deleted

## 2016-01-15 VITALS — BP 122/66 | HR 76 | Resp 14 | Ht 63.0 in | Wt 152.0 lb

## 2016-01-15 DIAGNOSIS — Z3042 Encounter for surveillance of injectable contraceptive: Secondary | ICD-10-CM

## 2016-01-15 MED ORDER — MEDROXYPROGESTERONE ACETATE 150 MG/ML IM SUSP
150.0000 mg | Freq: Once | INTRAMUSCULAR | Status: AC
  Administered 2016-01-15: 150 mg via INTRAMUSCULAR

## 2016-01-15 NOTE — Progress Notes (Signed)
Patient is here for Depo Provera Injection Patient is within Depo Provera Calender Limits 11/7-11/21 Next Depo Due between: 1/23-2/6 Last AEX: 10/30/15 DL AEX Scheduled: 4/09/818/28/18  Patient is aware when next depo is due  Right gluteus today. Pt tolerated Injection well.  Routed to provider for review, encounter closed.

## 2016-01-21 NOTE — Telephone Encounter (Signed)
Reviewed benefits with patient. Patient aware and agreeable to benefits and appointment date and time. Patient aware and agreeable to 48 hour cancellation policy. No further questions. Ok to close.

## 2016-01-29 ENCOUNTER — Ambulatory Visit (INDEPENDENT_AMBULATORY_CARE_PROVIDER_SITE_OTHER): Payer: Managed Care, Other (non HMO) | Admitting: Obstetrics and Gynecology

## 2016-01-29 ENCOUNTER — Encounter: Payer: Self-pay | Admitting: Obstetrics and Gynecology

## 2016-01-29 VITALS — BP 112/60 | HR 80 | Resp 16 | Wt 151.0 lb

## 2016-01-29 DIAGNOSIS — N871 Moderate cervical dysplasia: Secondary | ICD-10-CM

## 2016-01-29 DIAGNOSIS — Z01812 Encounter for preprocedural laboratory examination: Secondary | ICD-10-CM

## 2016-01-29 DIAGNOSIS — D061 Carcinoma in situ of exocervix: Secondary | ICD-10-CM | POA: Diagnosis not present

## 2016-01-29 DIAGNOSIS — D069 Carcinoma in situ of cervix, unspecified: Secondary | ICD-10-CM

## 2016-01-29 LAB — POCT URINE PREGNANCY: PREG TEST UR: NEGATIVE

## 2016-01-29 NOTE — Patient Instructions (Signed)

## 2016-01-29 NOTE — Progress Notes (Signed)
GYNECOLOGY  VISIT   HPI: 25 y.o.   Single  Caucasian  female   G0P0000 with No LMP recorded. Patient has had an injection.   here for LEEP recent colposcopy biopsy with CIN III.   GYNECOLOGIC HISTORY: No LMP recorded. Patient has had an injection. Contraception:Depo Provera Menopausal hormone therapy: none         OB History    Gravida Para Term Preterm AB Living   0 0 0 0 0 0   SAB TAB Ectopic Multiple Live Births   0 0 0 0           Patient Active Problem List   Diagnosis Date Noted  . Right distal ulnar fracture 07/21/2015  . History of chlamydia infection 08/18/2012  . History of substance abuse 07/08/2012    Past Medical History:  Diagnosis Date  . Amenorrhea   . Headache(784.0)   . Right distal ulnar fracture 07/20/2015  . Substance abuse    previous pain pill addiction    Past Surgical History:  Procedure Laterality Date  . GUM SURGERY     age 25  . I&D EXTREMITY Right 07/20/2015   Procedure: IRRIGATION AND DEBRIDEMENT RIGHT ARM;  Surgeon: Mack Hookavid Thompson, MD;  [facility];  Service: Orthopedics;  Laterality: Right;  . ORIF ULNAR FRACTURE Right 07/20/2015   Procedure: OPEN REDUCTION INTERNAL FIXATION (ORIF) RIGHT ULNAR FRACTURE;  Surgeon: Mack Hookavid Thompson, MD;  [facility];  Service: Orthopedics;  Laterality: Right;    Current Outpatient Prescriptions  Medication Sig Dispense Refill  . medroxyPROGESTERone (DEPO-PROVERA) 150 MG/ML injection Inject 1 mL (150 mg total) into the muscle every 3 (three) months. 1 mL 4   No current facility-administered medications for this visit.      ALLERGIES: Zithromax [azithromycin]  Family History  Problem Relation Age of Onset  . Aneurysm Mother   . Diabetes Paternal Grandmother   . Glaucoma Paternal Grandmother     Social History   Social History  . Marital status: Single    Spouse name: N/A  . Number of children: N/A  . Years of education: N/A   Occupational History  . Not on file.   Social History  Main Topics  . Smoking status: Former Smoker    Types: Cigarettes  . Smokeless tobacco: Never Used     Comment: 4  day  . Alcohol use Yes     Comment: occ  . Drug use:     Types: Marijuana     Comment: once every other day  . Sexual activity: Yes    Partners: Male    Birth control/ protection: Injection     Comment: depo injection   Other Topics Concern  . Not on file   Social History Narrative  . No narrative on file    Review of Systems  Constitutional: Negative.   HENT: Negative.   Eyes: Negative.   Respiratory: Negative.   Cardiovascular: Negative.   Gastrointestinal: Negative.   Genitourinary: Negative.   Musculoskeletal: Negative.   Skin: Negative.   Neurological: Negative.   Endo/Heme/Allergies: Negative.   Psychiatric/Behavioral: Negative.     PHYSICAL EXAMINATION:    BP 112/60 (BP Location: Right Arm, Patient Position: Sitting, Cuff Size: Normal)   Pulse 80   Resp 16   Wt 151 lb (68.5 kg)   BMI 26.75 kg/m     General appearance: alert, cooperative and appears stated age  Procedure: We discussed the option of close f/u with colposcopy and pap in 6  months or leep. Discussed the risks of both. She desires LEEP The patient was counseled as to the risks of the procedure, including: infection, bleeding, future pregnancy risks and cervical stenosis. A consent form was signed.  Colposcopy was performed, satisfactory.  Lugols solution was placed on the cervix and a paracervical block was injected using 1% lidocaine with epinephrine. The 2 x .8 cm loop was used to remove a portion of the ectocervix taking care to get the entire transformation zone. 2 passes were made The settings were 55 cut, 50 coag with a blend of 1.  An ECC was performed. The cautery ball was then used to cauterize the base of the biopsy site and extending out slightly more on the exocervix. Hemostasis was excellent. Monsels were placed. The patient tolerated the procedure well.     Chaperone was present for exam.  ASSESSMENT CIN III    PLAN LEEP done with ECC F/U in 1 month   An After Visit Summary was printed and given to the patient.

## 2016-02-26 ENCOUNTER — Ambulatory Visit (INDEPENDENT_AMBULATORY_CARE_PROVIDER_SITE_OTHER): Payer: Managed Care, Other (non HMO) | Admitting: Obstetrics and Gynecology

## 2016-02-26 ENCOUNTER — Encounter: Payer: Self-pay | Admitting: Obstetrics and Gynecology

## 2016-02-26 VITALS — BP 120/80 | HR 64 | Resp 16 | Ht 63.0 in | Wt 153.2 lb

## 2016-02-26 DIAGNOSIS — Z9889 Other specified postprocedural states: Secondary | ICD-10-CM

## 2016-02-26 NOTE — Progress Notes (Signed)
GYNECOLOGY  VISIT   HPI: 25 y.o.   Single  Caucasian  female   G0P0000 with No LMP recorded. Patient has had an injection.   here for f/u LEEP (done for CIN III) on 01/29/16. Pathology with CIN I with negative margins and negative ECC. She spotted for 2 weeks s/p leep. Not sexually active since the procedure.      GYNECOLOGIC HISTORY: No LMP recorded. Patient has had an injection. Contraception:Depo Provera Menopausal hormone therapy: none        OB History    Gravida Para Term Preterm AB Living   0 0 0 0 0 0   SAB TAB Ectopic Multiple Live Births   0 0 0 0           Patient Active Problem List   Diagnosis Date Noted  . Right distal ulnar fracture 07/21/2015  . History of chlamydia infection 08/18/2012  . History of substance abuse 07/08/2012    Past Medical History:  Diagnosis Date  . Amenorrhea   . Headache(784.0)   . Right distal ulnar fracture 07/20/2015  . Substance abuse    previous pain pill addiction    Past Surgical History:  Procedure Laterality Date  . GUM SURGERY     age 25  . I&D EXTREMITY Right 07/20/2015   Procedure: IRRIGATION AND DEBRIDEMENT RIGHT ARM;  Surgeon: Mack Hookavid Thompson, MD;  [facility];  Service: Orthopedics;  Laterality: Right;  . ORIF ULNAR FRACTURE Right 07/20/2015   Procedure: OPEN REDUCTION INTERNAL FIXATION (ORIF) RIGHT ULNAR FRACTURE;  Surgeon: Mack Hookavid Thompson, MD;  [facility];  Service: Orthopedics;  Laterality: Right;    Current Outpatient Prescriptions  Medication Sig Dispense Refill  . medroxyPROGESTERone (DEPO-PROVERA) 150 MG/ML injection Inject 1 mL (150 mg total) into the muscle every 3 (three) months. 1 mL 4   No current facility-administered medications for this visit.      ALLERGIES: Zithromax [azithromycin]  Family History  Problem Relation Age of Onset  . Aneurysm Mother   . Diabetes Paternal Grandmother   . Glaucoma Paternal Grandmother     Social History   Social History  . Marital status: Single   Spouse name: N/A  . Number of children: N/A  . Years of education: N/A   Occupational History  . Not on file.   Social History Main Topics  . Smoking status: Former Smoker    Types: Cigarettes  . Smokeless tobacco: Never Used     Comment: 4  day  . Alcohol use Yes     Comment: occ  . Drug use:     Types: Marijuana     Comment: once every other day  . Sexual activity: Yes    Partners: Male    Birth control/ protection: Injection     Comment: depo injection   Other Topics Concern  . Not on file   Social History Narrative  . No narrative on file    Review of Systems  Constitutional: Negative.   HENT: Negative.   Eyes: Negative.   Respiratory: Negative.   Cardiovascular: Negative.   Gastrointestinal: Negative.   Genitourinary: Negative.   Musculoskeletal: Negative.   Skin: Negative.   Neurological: Negative.   Endo/Heme/Allergies: Negative.   Psychiatric/Behavioral: Negative.     PHYSICAL EXAMINATION:    BP 120/80 (BP Location: Right Arm, Patient Position: Sitting, Cuff Size: Normal)   Pulse 64   Resp 16   Ht 5\' 3"  (1.6 m)   Wt 153 lb 3.2 oz (69.5  kg)   BMI 27.14 kg/m     General appearance: alert, cooperative and appears stated age  Pelvic: External genitalia:  no lesions              Urethra:  normal appearing urethra with no masses, tenderness or lesions              Bartholins and Skenes: normal                 Vagina: normal appearing vagina with normal color and discharge, no lesions              Cervix: no lesions and post leep appearance, slightly friable (treated with silver nitrate), short              Bimanual Exam:  Uterus:  normal size, contour, position, consistency, mobility, non-tender              Adnexa: no mass, fullness, tenderness                              Chaperone was present for exam.  ASSESSMENT S/p LEEP for CIN III, final pathology with CIN I, negative margins, negative ECC    PLAN F/U pap and hpv at her annual exam in  8/18   An After Visit Summary was printed and given to the patient.

## 2016-04-01 ENCOUNTER — Ambulatory Visit: Payer: Managed Care, Other (non HMO)

## 2016-04-01 ENCOUNTER — Ambulatory Visit (INDEPENDENT_AMBULATORY_CARE_PROVIDER_SITE_OTHER): Payer: Managed Care, Other (non HMO)

## 2016-04-01 VITALS — BP 118/72 | HR 60 | Resp 12 | Ht 63.0 in | Wt 155.2 lb

## 2016-04-01 DIAGNOSIS — Z3042 Encounter for surveillance of injectable contraceptive: Secondary | ICD-10-CM

## 2016-04-01 MED ORDER — MEDROXYPROGESTERONE ACETATE 150 MG/ML IM SUSP
150.0000 mg | Freq: Once | INTRAMUSCULAR | Status: AC
  Administered 2016-04-01: 150 mg via INTRAMUSCULAR

## 2016-04-01 NOTE — Progress Notes (Signed)
Patient is here for Depo Provera Injection Patient is within Depo Provera Calender Limits 1/23-04/15/16 Next Depo Due between: 4/10-4/24/18 Last AEX: 10/30/15 DL AEX Scheduled: 1/61/098/28/18 DL  Patient is aware when next depo is due  Pt tolerated Injection well. Injection given in LUOQ.  Routed to provider for review, encounter closed.

## 2016-06-23 ENCOUNTER — Ambulatory Visit (INDEPENDENT_AMBULATORY_CARE_PROVIDER_SITE_OTHER): Payer: 59

## 2016-06-23 VITALS — BP 116/70 | HR 68 | Resp 16 | Wt 152.0 lb

## 2016-06-23 DIAGNOSIS — Z3042 Encounter for surveillance of injectable contraceptive: Secondary | ICD-10-CM | POA: Diagnosis not present

## 2016-06-23 MED ORDER — MEDROXYPROGESTERONE ACETATE 150 MG/ML IM SUSP
150.0000 mg | Freq: Once | INTRAMUSCULAR | Status: AC
  Administered 2016-06-23: 150 mg via INTRAMUSCULAR

## 2016-06-23 NOTE — Progress Notes (Signed)
Patient is here for Depo Provera Injection Patient is within Depo Provera Calender Limits 4/10-4/24 Next Depo Due between: 7/2-7/16 Last AEX: 10/30/15 DL AEX Scheduled: 03/28/12  Patient is aware when next depo is due  Pt tolerated Injection well in RUOQ.  Routed to provider for review, encounter closed.

## 2016-09-08 ENCOUNTER — Ambulatory Visit: Payer: 59

## 2016-09-15 ENCOUNTER — Ambulatory Visit (INDEPENDENT_AMBULATORY_CARE_PROVIDER_SITE_OTHER): Payer: 59 | Admitting: *Deleted

## 2016-09-15 VITALS — BP 108/80 | HR 76 | Resp 16 | Ht 63.0 in | Wt 152.0 lb

## 2016-09-15 DIAGNOSIS — Z3042 Encounter for surveillance of injectable contraceptive: Secondary | ICD-10-CM | POA: Diagnosis not present

## 2016-09-15 MED ORDER — MEDROXYPROGESTERONE ACETATE 150 MG/ML IM SUSP
150.0000 mg | Freq: Once | INTRAMUSCULAR | Status: AC
  Administered 2016-09-15: 150 mg via INTRAMUSCULAR

## 2016-09-15 NOTE — Progress Notes (Signed)
Patient is here for Depo Provera Injection Patient is within Depo Provera Calender Limits: yes, last depo 06/23/16  Next Depo Due between: 9/24-10/8 Last AEX: 10/30/15  AEX Scheduled: 11/04/16 DL  Patient is aware when next depo is due  Pt tolerated Injection well. Left gluteus today   Routed to provider for review, encounter closed.

## 2016-11-04 ENCOUNTER — Ambulatory Visit: Payer: Managed Care, Other (non HMO) | Admitting: Certified Nurse Midwife

## 2016-12-01 ENCOUNTER — Ambulatory Visit: Payer: 59

## 2016-12-02 ENCOUNTER — Encounter: Payer: Self-pay | Admitting: Certified Nurse Midwife

## 2016-12-02 NOTE — Progress Notes (Signed)
26 y.o. G0P0000 Single  Caucasian Fe here for annual exam. Periods none with Depo Provera. Happy with choice.No partner change, no STD concerns.  Busy with school and work. Uses Marijuana for insomnia at times. Aware can be dependent on for this, and is decreasing use. Sees urgent care if needed. No health issues today.  No LMP recorded. Patient has had an injection.          Sexually active: Yes.    The current method of family planning is Depo-Provera injections.    Exercising: No.  exercise Smoker:  no  Health Maintenance: Pap:  10-20-14 ASCUS HPV HR +, 10-30-15 HGSIL History of Abnormal Pap: yes, colpo & LEEP 2017 MMG:  none Self Breast exams: yes Colonoscopy:  none BMD:   none TDaP:  2016 Shingles: no Pneumonia: no Hep C and HIV: not done Labs: yes   reports that she has quit smoking. Her smoking use included Cigarettes. She has never used smokeless tobacco. She reports that she uses drugs, including Marijuana. She reports that she does not drink alcohol.  Past Medical History:  Diagnosis Date  . Abnormal Pap smear of cervix    2016 ASCUS HPV HR+, 10-30-15 HGSIL  . Amenorrhea   . Headache(784.0)   . Right distal ulnar fracture 07/20/2015  . Substance abuse    previous pain pill addiction    Past Surgical History:  Procedure Laterality Date  . COLPOSCOPY    . GUM SURGERY     age 26  . I&D EXTREMITY Right 07/20/2015   Procedure: IRRIGATION AND DEBRIDEMENT RIGHT ARM;  Surgeon: Mack Hook, MD;  Location: [facility] OR;  Service: Orthopedics;  Laterality: Right;  . LEEP    . ORIF ULNAR FRACTURE Right 07/20/2015   Procedure: OPEN REDUCTION INTERNAL FIXATION (ORIF) RIGHT ULNAR FRACTURE;  Surgeon: Mack Hook, MD;  Location: [facility] OR;  Service: Orthopedics;  Laterality: Right;    Current Outpatient Prescriptions  Medication Sig Dispense Refill  . medroxyPROGESTERone (DEPO-PROVERA) 150 MG/ML injection Inject 1 mL (150 mg total) into the muscle every 3 (three) months. 1 mL 4   No  current facility-administered medications for this visit.     Family History  Problem Relation Age of Onset  . Aneurysm Mother   . Diabetes Paternal Grandmother   . Glaucoma Paternal Grandmother     ROS:  Pertinent items are noted in HPI.  Otherwise, a comprehensive ROS was negative.  Exam:   BP 108/64   Pulse 72   Resp 16   Ht 5' 3.25" (1.607 m)   Wt 153 lb (69.4 kg)   BMI 26.89 kg/m  Height: 5' 3.25" (160.7 cm) Ht Readings from Last 3 Encounters:  12/03/16 5' 3.25" (1.607 m)  09/15/16  (1.6 m)  04/01/16  (1.6 m)    General appearance: alert, cooperative and appears stated age Head: Normocephalic, without obvious abnormality, atraumatic Neck: no adenopathy, supple, symmetrical, trachea midline and thyroid normal to inspection and palpation Lungs: clear to auscultation bilaterally Breasts: normal appearance, no masses or tenderness, No nipple retraction or dimpling, No nipple discharge or bleeding, No axillary or supraclavicular adenopathy Heart: regular rate and rhythm Abdomen: soft, non-tender; no masses,  no organomegaly Extremities: extremities normal, atraumatic, no cyanosis or edema Skin: Skin color, texture, turgor normal. No rashes or lesions Lymph nodes: Cervical, supraclavicular, and axillary nodes normal. No abnormal inguinal nodes palpated Neurologic: Grossly normal   Pelvic: External genitalia:  no lesions  Urethra:  normal appearing urethra with no masses, tenderness or lesions              Bartholin's and Skene's: normal                 Vagina: normal appearing vagina with normal color and discharge, no lesions              Cervix: no cervical motion tenderness, no lesions and nulliparous appearance              Pap taken: Yes.   Bimanual Exam:  Uterus:  normal size, contour, position, consistency, mobility, non-tender              Adnexa: normal adnexa and no mass, fullness, tenderness               Rectovaginal: Confirms                Anus:  normal appearance  Chaperone present: yes  A:  Well Woman with normal exam  Contraception Depo Provera desired  Enlarged thyroid, questionable nodule on right  Follow up pap smear from HGSIL with LEEP 2017  P:   Reviewed health and wellness pertinent to exam  Rx Depo Provera 150 mg IM every 3 months x 4 Discussed enlarged thyroid and etiology of finding and need to evaluate with labs and possible [place]. questions addressed regarding thyroid and function. Patient not sure she can do [place] at this point. Will wait on labs and decide  Lab: TSH with panel.  Pap smear: yes   counseled on breast self exam, STD prevention, HIV risk factors and prevention, adequate intake of calcium and vitamin D, diet and exercise  return annually or prn  An After Visit Summary was printed and given to the patient.

## 2016-12-03 ENCOUNTER — Encounter: Payer: Self-pay | Admitting: Certified Nurse Midwife

## 2016-12-03 ENCOUNTER — Ambulatory Visit (INDEPENDENT_AMBULATORY_CARE_PROVIDER_SITE_OTHER): Payer: 59 | Admitting: Certified Nurse Midwife

## 2016-12-03 ENCOUNTER — Other Ambulatory Visit (HOSPITAL_COMMUNITY)
Admission: RE | Admit: 2016-12-03 | Discharge: 2016-12-03 | Disposition: A | Discharge status: Home / Self Care | Payer: 59 | Source: Ambulatory Visit | Attending: Certified Nurse Midwife | Admitting: Certified Nurse Midwife

## 2016-12-03 VITALS — BP 108/64 | HR 72 | Resp 16 | Ht 63.25 in | Wt 153.0 lb

## 2016-12-03 DIAGNOSIS — E049 Nontoxic goiter, unspecified: Secondary | ICD-10-CM | POA: Diagnosis not present

## 2016-12-03 DIAGNOSIS — Z124 Encounter for screening for malignant neoplasm of cervix: Secondary | ICD-10-CM | POA: Insufficient documentation

## 2016-12-03 DIAGNOSIS — E041 Nontoxic single thyroid nodule: Secondary | ICD-10-CM | POA: Diagnosis not present

## 2016-12-03 DIAGNOSIS — Z01419 Encounter for gynecological examination (general) (routine) without abnormal findings: Secondary | ICD-10-CM | POA: Diagnosis not present

## 2016-12-03 NOTE — Patient Instructions (Signed)

## 2016-12-04 ENCOUNTER — Telehealth: Payer: Self-pay | Admitting: Certified Nurse Midwife

## 2016-12-04 ENCOUNTER — Other Ambulatory Visit: Payer: Self-pay

## 2016-12-04 ENCOUNTER — Other Ambulatory Visit: Payer: Self-pay | Admitting: Certified Nurse Midwife

## 2016-12-04 DIAGNOSIS — E049 Nontoxic goiter, unspecified: Secondary | ICD-10-CM

## 2016-12-04 DIAGNOSIS — E041 Nontoxic single thyroid nodule: Secondary | ICD-10-CM

## 2016-12-04 LAB — THYROID PANEL WITH TSH
FREE THYROXINE INDEX: 2.9 (ref 1.2–4.9)
T3 Uptake Ratio: 29 % (ref 24–39)
T4, Total: 10.1 ug/dL (ref 4.5–12.0)
TSH: 1.51 u[IU]/mL (ref 0.450–4.500)

## 2016-12-04 NOTE — Telephone Encounter (Signed)
Patient states [facility] called her to schedule thyroid ultrasound.  Patient wants to know why she is having this study

## 2016-12-04 NOTE — Telephone Encounter (Signed)
Spoke with patient, advised as seen below per Leota Sauers, CNM. Reviewed thyroid US and scheduling, questions answered. Patient aware to return call to [facility] to schedule [place]. Patient verbalizes understanding and is agreeable.    Notes recorded by Verner Chol, CNM on 12/04/2016 at 8:32 AM EDT Notify patient that TSH with panel is normal, but T4 is slightly elevated and feel [place] is needed. Will be called with information on scheduling  Patient is agreeable to disposition. Will close encounter.

## 2016-12-05 LAB — CYTOLOGY - PAP: HPV: DETECTED — AB

## 2016-12-08 ENCOUNTER — Ambulatory Visit (INDEPENDENT_AMBULATORY_CARE_PROVIDER_SITE_OTHER): Payer: 59

## 2016-12-08 VITALS — BP 110/58 | HR 80 | Resp 16 | Wt 152.0 lb

## 2016-12-08 DIAGNOSIS — Z3042 Encounter for surveillance of injectable contraceptive: Secondary | ICD-10-CM

## 2016-12-08 MED ORDER — MEDROXYPROGESTERONE ACETATE 150 MG/ML IM SUSP
150.0000 mg | Freq: Once | INTRAMUSCULAR | Status: AC
  Administered 2016-12-08: 150 mg via INTRAMUSCULAR

## 2016-12-08 NOTE — Progress Notes (Signed)
Patient is here for Depo Provera Injection Patient is within Depo Provera Calender Limits 9/24-10/8 Next Depo Due between: 12/17-12/31 Last AEX: 12/03/16 DL AEX Scheduled: 1/61/09  Patient is aware when next depo is due  Pt tolerated Injection well in RUOQ.  Routed to provider for review, encounter closed.

## 2016-12-10 ENCOUNTER — Telehealth: Payer: Self-pay | Admitting: *Deleted

## 2016-12-10 NOTE — Telephone Encounter (Signed)
Spoke with patient, scheduled for OV with Dr. Reyne Dumas on 12/15/16 at 4:15pm to discuss recent pap results and recommendations. Patient apologized for abruptness during previous conversation. Patient plans to bring her mother to appointment with her.   Routing to provider for final review. Patient is agreeable to disposition. Will close encounter.   Cc: Leota Sauers, CNM

## 2016-12-10 NOTE — Telephone Encounter (Signed)
Notes recorded by Leda Min, RN on 12/10/2016 at 1:46 PM EDT Left message to call Noreene Larsson at [phone]. See telephone encounter dated 12/10/16. 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 Oscar La. Please schedule ------

## 2016-12-10 NOTE — Telephone Encounter (Signed)
Spoke with patient, advised of results and recommendations as seen below.   Patient abrupt, voiced concern about results and recommendations.  States she will review with mother and grandmother, would like second opinion, has had gardasil vaccine in past, "no way I have HPV". Patient ended call. Patient did not allow for any further explanation from RN.   Cc: Billie Ruddy, RN

## 2016-12-10 NOTE — Telephone Encounter (Signed)
Left message to call Jani Moronta at [phone].  

## 2016-12-10 NOTE — Telephone Encounter (Signed)
Patient has some more questions for [facility]. She wants to come in to ask her these questions. She states she will be available after 4pm.

## 2016-12-15 ENCOUNTER — Ambulatory Visit (INDEPENDENT_AMBULATORY_CARE_PROVIDER_SITE_OTHER): Payer: 59 | Admitting: Obstetrics and Gynecology

## 2016-12-15 ENCOUNTER — Encounter: Payer: Self-pay | Admitting: Obstetrics and Gynecology

## 2016-12-15 VITALS — BP 110/64 | HR 64 | Resp 14 | Wt 154.0 lb

## 2016-12-15 DIAGNOSIS — R87612 Low grade squamous intraepithelial lesion on cytologic smear of cervix (LGSIL): Secondary | ICD-10-CM | POA: Diagnosis not present

## 2016-12-15 NOTE — Patient Instructions (Signed)
Colposcopy  Colposcopy is a procedure to examine the lowest part of the uterus (cervix), the vagina, and the area around the vaginal opening (vulva) for abnormalities or signs of disease. The procedure is done using a lighted microscope or magnifying lens (colposcope). If any unusual cells are found during the procedure, your health care provider may remove a tissue sample for testing (biopsy). A colposcopy may be done if you:   Have an abnormal Pap test. A Pap test is a screening test that is used to check for signs of cancer or infection of the vagina, cervix, and uterus.   Have a Pap smear test in which you test positive for high-risk HPV (human papillomavirus).   Have a sore or lesion on your cervix.   Have genital warts on your vulva, vagina, or cervix.   Took certain medicines while pregnant, such as diethylstilbestrol (DES).   Have pain during sexual intercourse.   Have vaginal bleeding, especially after sexual intercourse.   Need to have a cervical polyp removed.   Need to have a lost intrauterine device (IUD) string located.    Let your health care provider know about:   Any allergies you have, including allergies to prescribed medicine, latex, or iodine.   All medicines you are taking, including vitamins, herbs, eye drops, creams, and over-the-counter medicines. Bring a list of all of your medicines to your appointment.   Any problems you or family members have had with anesthetic medicines.   Any blood disorders you have.   Any surgeries you have had.   Any medical conditions you have, such as pelvic inflammatory disease (PID) or endometrial disorder.   Any history of frequent fainting.   Your menstrual cycle and what form of birth control (contraception) you use.   Your medical history, including any prior cervical treatment.   Whether you are pregnant or may be pregnant.  What are the risks?  Generally, this is a safe procedure. However, problems may occur,  including:   Pain.   Infection, which may include a fever, bad-smelling discharge, or pelvic pain.   Bleeding or discharge.   Misdiagnosis.   Fainting and vasovagal reactions, but this is rare.   Allergic reactions to medicines.   Damage to other structures or organs.    What happens before the procedure?   If you have your menstrual period or will have it at the time of your procedure, tell your health care provider. A colposcopy typically is not done during menstruation.   Continue your contraceptive practices before and after the procedure.   For 24 hours before the colposcopy:  ? Do not douche.  ? Do not use tampons.  ? Do not use medicines, creams, or suppositories in the vagina.  ? Do not have sexual intercourse.   Ask your health care provider about:  ? Changing or stopping your regular medicines. This is especially important if you are taking diabetes medicines or blood thinners.  ? Taking medicines such as aspirin and ibuprofen. These medicines can thin your blood. Do not take these medicines before your procedure if your health care provider instructs you not to. It is likely that your health care provider will tell you to avoid taking aspirin or medicine that contains aspirin for 7 days before the procedure.   Follow instructions from your health care provider about eating or drinking restrictions. You will likely need to eat a regular diet the day of the procedure and not skip any meals.   You   may have an exam or testing. A pregnancy test will be taken on the day of the procedure.   You may have a blood or urine sample taken.   Plan to have someone take you home from the hospital or clinic.   If you will be going home right after the procedure, plan to have someone with you for 24 hours.  What happens during the procedure?   You will lie down on your back, with your feet in foot rests (stirrups).   A warmed and lubricated instrument (speculum) will be inserted into your vagina. The  speculum will be used to hold apart the walls of your vagina so your health care provider can see your cervix and the inside of your vagina.   A cotton swab will be used to place a small amount of liquid solution on the areas to be examined. This solution makes it easier to see abnormal cells. You may feel a slight burning during this part.   The colposcope will be used to scan the cervix with a bright white light. The colposcope will be held near your vulvaand will magnify your vulva, vagina, and cervix for easier examination.   Your health care provider may decide to take a biopsy. If so:  ? You may be given medicine to numb the area (local anesthetic).  ? Surgical instruments will be used to suck out mucus and cells through your vagina.  ? You may feel mild pain while the tissue sample is removed.  ? Bleeding may occur. A solution may be used to stop the bleeding.  ? If a sample of tissue is needed from the inside of the cervix, a different procedure called endocervical curettage (ECC) may be completed. During this procedure, a curved instrument (curette) will be used to scrape cells from your cervix or the top of your cervix (endocervix).   Your health care provider will record the location of any abnormalities.  The procedure may vary among health care providers and hospitals.  What happens after the procedure?   You will lie down and rest for a few minutes. You may be offered juice or cookies.   Your blood pressure, heart rate, breathing rate, and blood oxygen level will be monitored until any medicines you were given have worn off.   You may have to wear compression stockings. These stockings help to prevent blood clots and reduce swelling in your legs.   You may have some cramping in your abdomen. This should go away after a few minutes.  This information is not intended to replace advice given to you by your health care provider. Make sure you discuss any questions you have with your health care  provider.  Document Released: 05/17/2002 Document Revised: 10/23/2015 Document Reviewed: 10/01/2015  Elsevier Interactive Patient Education  2018 Elsevier Inc[employer]

## 2016-12-15 NOTE — Progress Notes (Signed)
GYNECOLOGY  VISIT   HPI: 26 y.o.   Single  Caucasian  female   G0P0000 with No LMP recorded. Patient has had an injection.   here to discuss PAP results. 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  GYNECOLOGIC HISTORY: No LMP recorded. Patient has had an injection. Contraception:Depo Provera Menopausal hormone therapy: none         OB History    Gravida Para Term Preterm AB Living   0 0 0 0 0 0   SAB TAB Ectopic Multiple Live Births   0 0 0 0           Patient Active Problem List   Diagnosis Date Noted  . Pain and swelling of right wrist 08/08/2015  . Stiffness in joint 08/08/2015  . Right distal ulnar fracture 07/21/2015  . History of chlamydia infection 08/18/2012  . History of substance abuse 07/08/2012    Past Medical History:  Diagnosis Date  . Abnormal Pap smear of cervix    2016 ASCUS HPV HR+, 10-30-15 HGSIL  . Amenorrhea   . Headache(784.0)   . Right distal ulnar fracture 07/20/2015  . Substance abuse (HCC)    previous pain pill addiction    Past Surgical History:  Procedure Laterality Date  . COLPOSCOPY    . GUM SURGERY     age 13  . I&D EXTREMITY Right 07/20/2015   Procedure: IRRIGATION AND DEBRIDEMENT RIGHT ARM;  Surgeon: Mack Hook, MD;  Location: [facility] OR;  Service: Orthopedics;  Laterality: Right;  . LEEP    . ORIF ULNAR FRACTURE Right 07/20/2015   Procedure: OPEN REDUCTION INTERNAL FIXATION (ORIF) RIGHT ULNAR FRACTURE;  Surgeon: Mack Hook, MD;  Location: [facility] OR;  Service: Orthopedics;  Laterality: Right;    Current Outpatient Prescriptions  Medication Sig Dispense Refill  . cetirizine (ZYRTEC) 10 MG tablet Take 10 mg by mouth daily.    . medroxyPROGESTERone (DEPO-PROVERA) 150 MG/ML injection Inject 1 mL (150 mg total) into the muscle every 3 (three) months. 1 mL 4   No current facility-administered medications for this  visit.      ALLERGIES: Zithromax [azithromycin]  Family History  Problem Relation Age of Onset  . Aneurysm Mother   . Diabetes Paternal Grandmother   . Glaucoma Paternal Grandmother     Social History   Social History  . Marital status: Single    Spouse name: N/A  . Number of children: N/A  . Years of education: N/A   Occupational History  . Not on file.   Social History Main Topics  . Smoking status: Former Smoker    Types: Cigarettes  . Smokeless tobacco: Never Used     Comment: 4  day  . Alcohol use No  . Drug use: Yes    Types: Marijuana     Comment: every day to sleep  . Sexual activity: Yes    Partners: Male    Birth control/ protection: Injection     Comment: depo injection   Other Topics Concern  . Not on file   Social History Narrative  . No narrative on file    Review of Systems  Constitutional: Negative.   HENT: Negative.   Eyes: Negative.   Cardiovascular: Negative.   Gastrointestinal: Negative.   Genitourinary: Negative.   Musculoskeletal: Negative.   Skin: Negative.   Neurological: Negative.   Endo/Heme/Allergies: Negative.  Psychiatric/Behavioral: Negative.     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  ~10-15 minutes face to face time of which over 50% was spent in counseling.

## 2016-12-24 ENCOUNTER — Telehealth: Payer: Self-pay | Admitting: Certified Nurse Midwife

## 2016-12-24 NOTE — Telephone Encounter (Signed)
I think if she chooses to follow with PCP that is fine. She will probably need copy of labs, if not on epic

## 2016-12-24 NOTE — Telephone Encounter (Signed)
Patient returned call in regards to scheduling recommended thyroid ultrasound. Patient states she is working two jobs, she is in school and is about to have her wisdom teeth removed and is unable to schedule anything at this time. Patient also adds her father, grandfather and aunt, all have thyroid issues and they have encouraged her to follow up with her primary care doctor. Patients states she is going to follow their and see her primary doctor, but advises she will be us informed.  Routing to Leota Sauerseborah Leonard, CNM  cc: Carmelina DaneJill Hamm, RN

## 2016-12-24 NOTE — Telephone Encounter (Signed)
Call placed to patient to follow up in regards to scheduling recommended thyroid ultrasound.  See 12/04/16 phone encounter. Left voicemail message requesting a return call   cc: Leota Sauerseborah Leonard, CNM

## 2016-12-25 NOTE — Telephone Encounter (Signed)
Left message to call Ravinder Hofland at [phone]. 

## 2017-01-13 ENCOUNTER — Telehealth: Payer: Self-pay | Admitting: *Deleted

## 2017-01-13 DIAGNOSIS — R87612 Low grade squamous intraepithelial lesion on cytologic smear of cervix (LGSIL): Secondary | ICD-10-CM

## 2017-01-13 IMAGING — RF DG WRIST 2V*R*
1 series · 2 of 2 positions shown · non-contrast
Comparison: 07/20/2015.

CLINICAL DATA: 24-year-old female with history of ulnar fracture
from a dog bite. Status post ORIF.

EXAM:
RIGHT WRIST - 2 VIEW; DG C-ARM 1-60 MIN-NO REPORT

[Series 1: run · 2 of 2 slices shown]
[im 1/2]
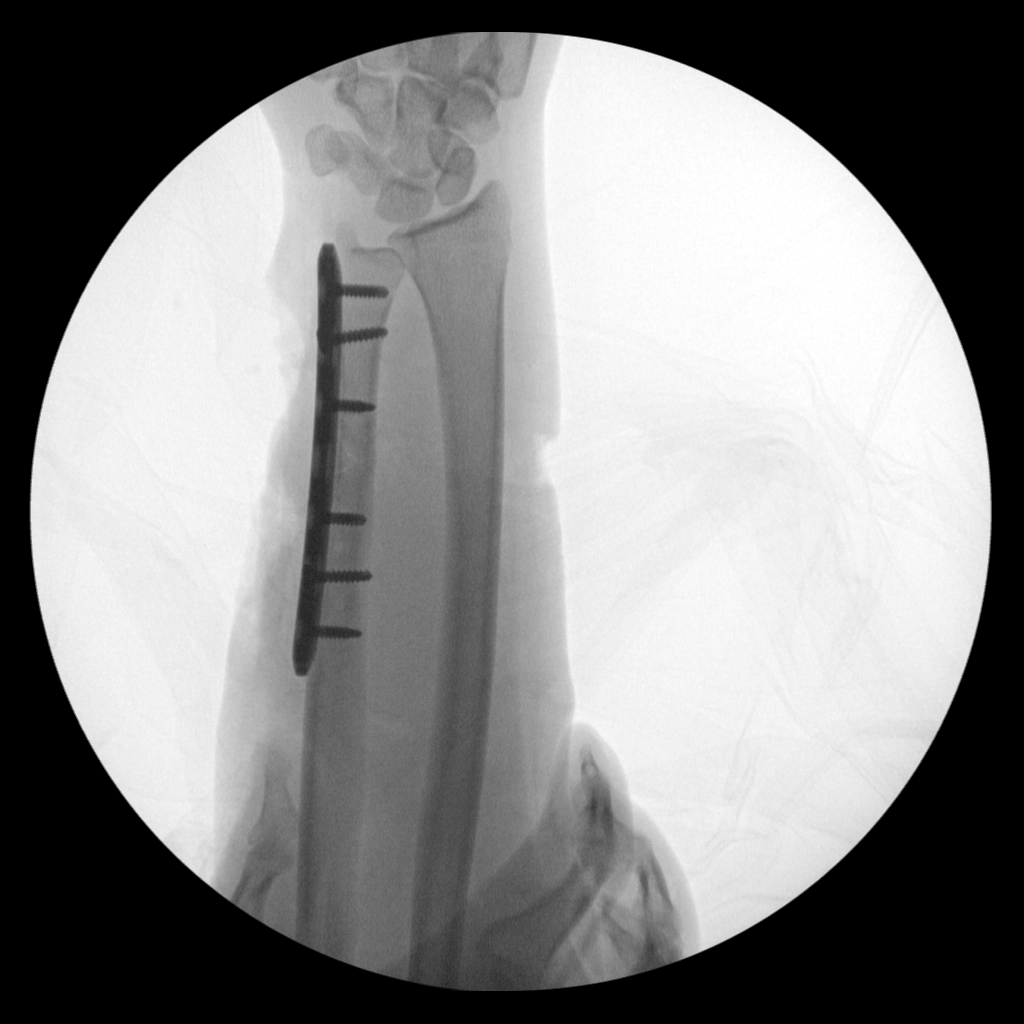
[im 2/2]
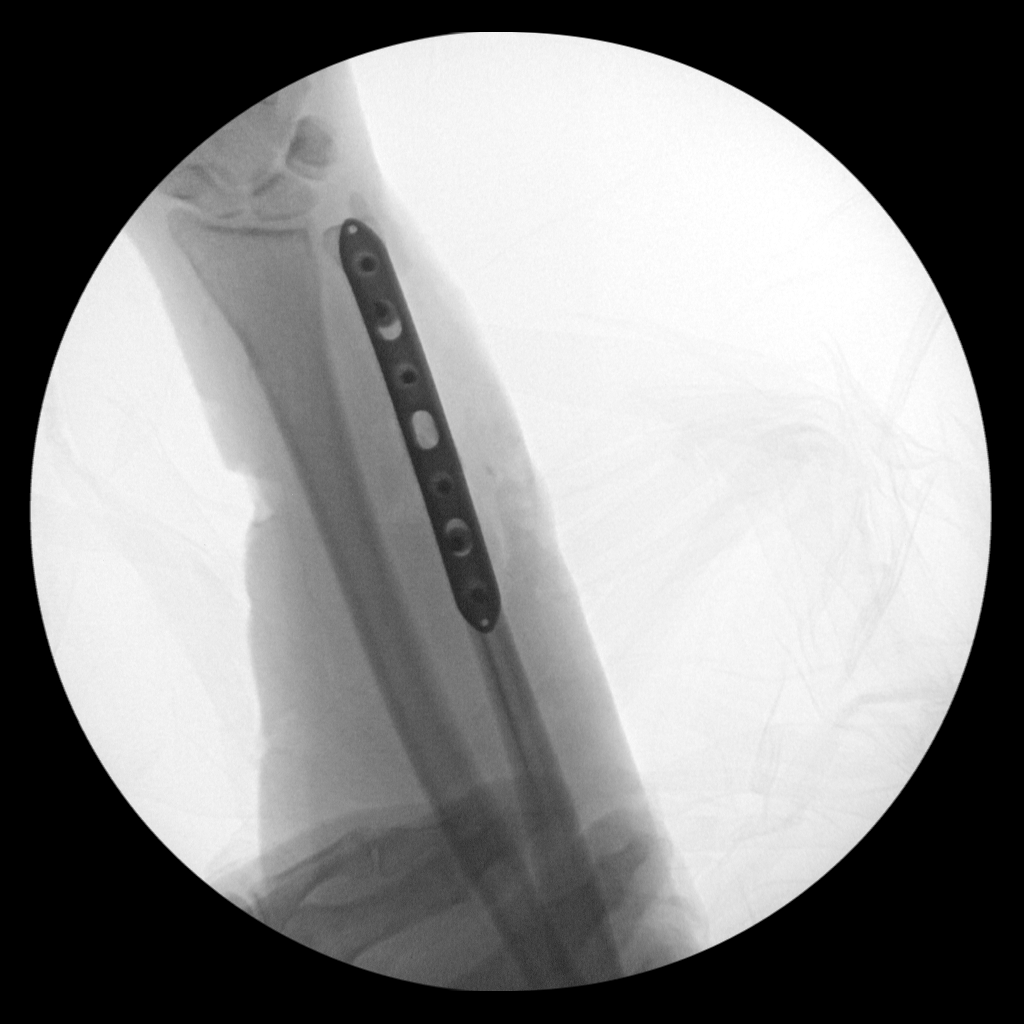

[2 of 2 positions shown; findings below may reference images not displayed]

FINDINGS: Two intraoperative fluoroscopic spot films of the right wrist and
distal right form demonstrate interval placement of a lateral plate
and screw fixation device traversing the previously demonstrated
mildly comminuted fracture of the distal third of the ulnar
diaphysis. Anatomic alignment has been restored. Overlying soft
tissues remain irregular, indicative of lacerations and puncture
wounds from the dog bite.
IMPRESSION: 1. ORIF of distal ulnar fracture, as above.

## 2017-01-13 NOTE — Telephone Encounter (Signed)
Left message to call Noreene LarssonJill at [phone].   Lorri FrederickHanner, Martha E, LPN   0:989:40 AM  Note  Patient has not been scheduled for colposcopy yet. Please contact patient regarding scheduling Thanks

## 2017-01-13 NOTE — Telephone Encounter (Signed)
Patient has not been scheduled for colposcopy yet. Please contact patient regarding scheduling Thanks

## 2017-01-13 NOTE — Telephone Encounter (Signed)
See telephone encounter dated 01/13/17. 

## 2017-01-13 NOTE — Telephone Encounter (Signed)
Left message to call Kaitlyn at [phone]. 

## 2017-01-16 NOTE — Telephone Encounter (Signed)
Leota Sauerseborah Leonard CNM attempted to reach this patient x2 with no return call. Please advise.

## 2017-01-16 NOTE — Telephone Encounter (Signed)
Ok to close note, but put in recall for one  Month to see if seen PCP

## 2017-01-19 NOTE — Telephone Encounter (Signed)
Patient returning Jill's call.  °

## 2017-01-19 NOTE — Telephone Encounter (Signed)
Left detailed message, ok per current dpr. Advised to [facility] [place] [facility] in regards to scheduling colposcopy.

## 2017-01-19 NOTE — Telephone Encounter (Signed)
Routing to S. Yeakley, RN.  

## 2017-01-20 NOTE — Telephone Encounter (Signed)
Return call to patient. She is ready to schedule colpo.  On Depo so does not have cycle. Colpo scheduled for 01-26-17 with Dr Oscar LaJertson per patient request. Patient needs Monday appointment. Instructed to take motrin 800 mg one hour prior with food.  Also reminded patient about thyroid ultrasound recommended by Lovett Soxebbi Leonard CNM. Patient states has just had wisdom teeth removed last week but she will follow-up with PCP about thyroid.    Routing to provider for final review. Patient agreeable to disposition. Will close encounter.

## 2017-01-20 NOTE — Telephone Encounter (Signed)
Spoke to patient while scheduling colposcopy. Discussed importance of follow-up with PCP. Patient states she has has oral surgery and she will schedule this PCP follow-up. She states is very aware of importance of this.

## 2017-01-22 ENCOUNTER — Telehealth: Payer: Self-pay | Admitting: Obstetrics and Gynecology

## 2017-01-22 NOTE — Telephone Encounter (Signed)
Call placed to patient to convey benefit information for scheduled colposcopy on 01/26/17. Left voicemail message requesting a return call.

## 2017-01-26 ENCOUNTER — Encounter: Payer: Self-pay | Admitting: Obstetrics and Gynecology

## 2017-01-26 ENCOUNTER — Other Ambulatory Visit: Payer: Self-pay

## 2017-01-26 ENCOUNTER — Ambulatory Visit (INDEPENDENT_AMBULATORY_CARE_PROVIDER_SITE_OTHER): Payer: 59 | Admitting: Obstetrics and Gynecology

## 2017-01-26 VITALS — BP 122/80 | HR 80 | Resp 16 | Wt 155.0 lb

## 2017-01-26 DIAGNOSIS — R87612 Low grade squamous intraepithelial lesion on cytologic smear of cervix (LGSIL): Secondary | ICD-10-CM

## 2017-01-26 DIAGNOSIS — Z01812 Encounter for preprocedural laboratory examination: Secondary | ICD-10-CM

## 2017-01-26 LAB — POCT URINE PREGNANCY: PREG TEST UR: NEGATIVE

## 2017-01-26 NOTE — Patient Instructions (Signed)

## 2017-01-26 NOTE — Progress Notes (Signed)
GYNECOLOGY  VISIT   HPI: 26 y.o.   Single  Caucasian  female   G0P0000 with No LMP recorded. Patient has had an injection.   here for a colposcopy.   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  GYNECOLOGIC HISTORY: No LMP recorded. Patient has had an injection. Contraception:Depo Provera Menopausal hormone therapy: none        OB History    Gravida Para Term Preterm AB Living   0 0 0 0 0 0   SAB TAB Ectopic Multiple Live Births   0 0 0 0           Patient Active Problem List   Diagnosis Date Noted  . Pain and swelling of right wrist 08/08/2015  . Stiffness in joint 08/08/2015  . Right distal ulnar fracture 07/21/2015  . History of chlamydia infection 08/18/2012  . History of substance abuse 07/08/2012    Past Medical History:  Diagnosis Date  . Abnormal Pap smear of cervix    2016 ASCUS HPV HR+, 10-30-15 HGSIL  . Amenorrhea   . Headache(784.0)   . Right distal ulnar fracture 07/20/2015  . Substance abuse (HCC)    previous pain pill addiction    Past Surgical History:  Procedure Laterality Date  . COLPOSCOPY    . GUM SURGERY     age 26  . IRRIGATION AND DEBRIDEMENT RIGHT ARM Right 07/20/2015   [facility] Mack Hookhompson, David[facility]  . LEEP    . OPEN REDUCTION INTERNAL FIXATION (ORIF) RIGHT ULNAR FRACTURE Right 07/20/2015   [facility] Mack Hookhompson[facility] David[facility]    Current Outpatient Medications  Medication Sig Dispense Refill  . cetirizine (ZYRTEC) 10 MG tablet Take 10 mg by mouth daily.    . medroxyPROGESTERone (DEPO-PROVERA) 150 MG/ML injection Inject 1 mL (150 mg total) into the muscle every 3 (three) months. 1 mL 4   No current facility-administered medications for this visit.      ALLERGIES: Zithromax [azithromycin]  Family History  Problem Relation Age of Onset  . Aneurysm Mother   . Diabetes Paternal Grandmother   .  Glaucoma Paternal Grandmother     Social History   Socioeconomic History  . Marital status: Single    Spouse name: Not on file  . Number of children: Not on file  . Years of education: Not on file  . Highest education level: Not on file  Social Needs  . Financial resource strain: Not on file  . Food insecurity - worry: Not on file  . Food insecurity - inability: Not on file  . Transportation needs - medical: Not on file  . Transportation needs - non-medical: Not on file  Occupational History  . Not on file  Tobacco Use  . Smoking status: Former Smoker    Types: Cigarettes  . Smokeless tobacco: Never Used  . Tobacco comment: 4  day  Substance and Sexual Activity  . Alcohol use: No  . Drug use: Yes    Types: Marijuana    Comment: every day to sleep  . Sexual activity: Yes    Partners: Male    Birth control/protection: Injection    Comment: depo injection  Other Topics Concern  . Not on file  Social History Narrative  . Not on file    Review of Systems  Constitutional: Negative.   HENT:  Negative.   Eyes: Negative.   Respiratory: Negative.   Cardiovascular: Negative.   Gastrointestinal: Negative.   Genitourinary: Negative.   Musculoskeletal: Negative.   Skin: Negative.   Neurological: Negative.   Endo/Heme/Allergies: Negative.   Psychiatric/Behavioral: Negative.     PHYSICAL EXAMINATION:    BP 122/80 (BP Location: Right Arm, Patient Position: Sitting, Cuff Size: Normal)   Pulse 80   Resp 16   Wt 155 lb (70.3 kg)   BMI 27.24 kg/m     General appearance: alert, cooperative and appears stated age  Pelvic: External genitalia:  no lesions              Urethra:  normal appearing urethra with no masses, tenderness or lesions              Bartholins and Skenes: normal                 Vagina: normal appearing vagina with normal color and discharge, no lesions              Cervix: no gross lesions, evidence of prior leep, cervix is very short.   Colposcopy:  satisfactory, mild acetowhite changes anteriorly, biopsy taken at 11 o'clock. ECC done. Negative lugols examination of the upper vagina.   Chaperone was present for exam.  ASSESSMENT H/O LEEP for HSIL, negative margins and negative ECC. Now with LSIL, +HPV    PLAN Colposcopy with biopsy and ECC, further plans depending on the results   An After Visit Summary was printed and given to the patient.  CC: Sara Chuebbie Leonard, CNM

## 2017-02-04 NOTE — Telephone Encounter (Signed)
Colposcopy completed in out office on 01/26/17

## 2017-02-24 ENCOUNTER — Ambulatory Visit (INDEPENDENT_AMBULATORY_CARE_PROVIDER_SITE_OTHER): Payer: 59 | Admitting: *Deleted

## 2017-02-24 ENCOUNTER — Telehealth: Payer: Self-pay | Admitting: *Deleted

## 2017-02-24 ENCOUNTER — Other Ambulatory Visit: Payer: Self-pay

## 2017-02-24 VITALS — BP 102/56 | HR 76 | Resp 14 | Wt 157.2 lb

## 2017-02-24 DIAGNOSIS — Z3042 Encounter for surveillance of injectable contraceptive: Secondary | ICD-10-CM

## 2017-02-24 MED ORDER — MEDROXYPROGESTERONE ACETATE 150 MG/ML IM SUSP
150.0000 mg | Freq: Once | INTRAMUSCULAR | Status: AC
  Administered 2017-02-24: 150 mg via INTRAMUSCULAR

## 2017-02-24 NOTE — Progress Notes (Signed)
Patient is here for Depo Provera Injection Patient is within Depo Provera Calender Limits yes, last depo 12/08/16  Next Depo Due between: 3/5-3/19  Last AEX: 12/03/16 DL  AEX Scheduled: 5/63/879/27/19   Patient is aware when next depo is due  Pt tolerated Injection well in LUOQ.   Routed to provider for review, encounter closed.   Patient states she saw her PCP, Dr. Aniceto Bossitcher, about 2 weeks ago and had thyroid testing done which patient states was all normal.  Patient states she has not had thyroid US done because PCP did not recommend proceeding with US following normal labs.

## 2017-02-24 NOTE — Telephone Encounter (Signed)
Leota Sauerseborah Leonard, CNM -see nurse visit notes from 02/24/17. Order for thyroid US canceled in Epic.

## 2017-02-24 NOTE — Telephone Encounter (Signed)
Will close encounter

## 2017-02-24 NOTE — Telephone Encounter (Signed)
-----   Message from Verner Choleborah S Leonard, CNM sent at 02/23/2017  6:48 AM EST ----- Regarding: thyroid us Patient was scheduled but no response, if does not plan to have will cancel referral for. This was a follow up.

## 2017-02-24 NOTE — Telephone Encounter (Signed)
Thank you :)

## 2017-05-12 ENCOUNTER — Ambulatory Visit (INDEPENDENT_AMBULATORY_CARE_PROVIDER_SITE_OTHER): Payer: 59 | Admitting: *Deleted

## 2017-05-12 ENCOUNTER — Other Ambulatory Visit: Payer: Self-pay

## 2017-05-12 VITALS — BP 112/64 | HR 78 | Resp 12 | Wt 139.0 lb

## 2017-05-12 DIAGNOSIS — Z3042 Encounter for surveillance of injectable contraceptive: Secondary | ICD-10-CM | POA: Diagnosis not present

## 2017-05-12 MED ORDER — MEDROXYPROGESTERONE ACETATE 150 MG/ML IM SUSP
150.0000 mg | Freq: Once | INTRAMUSCULAR | Status: AC
  Administered 2017-05-12: 150 mg via INTRAMUSCULAR

## 2017-05-12 NOTE — Progress Notes (Signed)
Patient is here for Depo Provera Injection Patient is within Depo Provera Calender Limits yes, last depo 02/24/17  Next Depo Due between: 5/21-6/4 Last AEX: 12/03/16 DL  AEX Scheduled: 5/36/649/27/19   Patient is aware when next depo is due  Pt tolerated Injection well in RUOQ.  Routed to provider for review, encounter closed.

## 2017-07-28 ENCOUNTER — Ambulatory Visit (INDEPENDENT_AMBULATORY_CARE_PROVIDER_SITE_OTHER): Payer: 59

## 2017-07-28 VITALS — BP 120/64 | HR 60 | Ht 63.25 in | Wt 153.0 lb

## 2017-07-28 DIAGNOSIS — Z3042 Encounter for surveillance of injectable contraceptive: Secondary | ICD-10-CM

## 2017-07-28 MED ORDER — MEDROXYPROGESTERONE ACETATE 150 MG/ML IM SUSP
150.0000 mg | Freq: Once | INTRAMUSCULAR | Status: AC
  Administered 2017-07-28: 150 mg via INTRAMUSCULAR

## 2017-07-28 NOTE — Progress Notes (Signed)
Patient is here for Depo Provera Injection Patient is within Depo Provera Calender Limits Yes Next Depo Due between: 10/13/17 - 10/27/17 Last AEX: 12-03-16 AEX Scheduled: 12-04-17  Patient is aware when next depo is due 8/6 -8/20  Pt tolerated Injection well in LUOQ.  Routed to provider for review, encounter closed.

## 2017-10-13 ENCOUNTER — Other Ambulatory Visit: Payer: Self-pay

## 2017-10-13 ENCOUNTER — Ambulatory Visit (INDEPENDENT_AMBULATORY_CARE_PROVIDER_SITE_OTHER): Payer: 59 | Admitting: *Deleted

## 2017-10-13 VITALS — BP 122/82 | HR 78 | Resp 14 | Ht 63.0 in | Wt 152.1 lb

## 2017-10-13 DIAGNOSIS — Z3042 Encounter for surveillance of injectable contraceptive: Secondary | ICD-10-CM | POA: Diagnosis not present

## 2017-10-13 MED ORDER — MEDROXYPROGESTERONE ACETATE 150 MG/ML IM SUSP
150.0000 mg | Freq: Once | INTRAMUSCULAR | Status: AC
  Administered 2017-10-13: 150 mg via INTRAMUSCULAR

## 2017-10-13 NOTE — Progress Notes (Signed)
Patient is here for Depo Provera Injection Patient is within Depo Provera Calender Limits yes, last given 07-28-17  Next Depo Due between: 10-22 to 11-5  Last AEX: 12-03-16 DL  AEX Scheduled: 4-13-249-27-19   Patient is aware when next depo is due 10-22 to 11-5  Pt tolerated Injection well in RUOQ.  Routed to provider for review, encounter closed.

## 2017-12-04 ENCOUNTER — Other Ambulatory Visit: Payer: Self-pay

## 2017-12-04 ENCOUNTER — Encounter: Payer: Self-pay | Admitting: Certified Nurse Midwife

## 2017-12-04 ENCOUNTER — Other Ambulatory Visit (HOSPITAL_COMMUNITY)
Admission: RE | Admit: 2017-12-04 | Discharge: 2017-12-04 | Disposition: A | Discharge status: Home / Self Care | Payer: 59 | Source: Ambulatory Visit | Attending: Obstetrics & Gynecology | Admitting: Obstetrics & Gynecology

## 2017-12-04 ENCOUNTER — Ambulatory Visit: Payer: 59 | Admitting: Certified Nurse Midwife

## 2017-12-04 VITALS — BP 104/60 | HR 68 | Resp 16 | Ht 63.0 in | Wt 148.0 lb

## 2017-12-04 DIAGNOSIS — Z8742 Personal history of other diseases of the female genital tract: Secondary | ICD-10-CM

## 2017-12-04 DIAGNOSIS — Z01419 Encounter for gynecological examination (general) (routine) without abnormal findings: Secondary | ICD-10-CM | POA: Diagnosis not present

## 2017-12-04 DIAGNOSIS — Z124 Encounter for screening for malignant neoplasm of cervix: Secondary | ICD-10-CM | POA: Diagnosis not present

## 2017-12-04 DIAGNOSIS — Z3042 Encounter for surveillance of injectable contraceptive: Secondary | ICD-10-CM

## 2017-12-04 DIAGNOSIS — Z87898 Personal history of other specified conditions: Secondary | ICD-10-CM | POA: Diagnosis not present

## 2017-12-04 MED ORDER — MEDROXYPROGESTERONE ACETATE 150 MG/ML IM SUSP: 150.0000 mg | INTRAMUSCULAR | 4 refills | Status: DC

## 2017-12-04 NOTE — Patient Instructions (Signed)
General topics  Next pap or exam is  due in 1 year Take a Women's multivitamin Take 1200 mg. of calcium daily - prefer dietary If any concerns in interim to call back  Breast Self-Awareness Practicing breast self-awareness may pick up problems early, prevent significant medical complications, and possibly save your life. By practicing breast self-awareness, you can become familiar with how your breasts look and feel and if your breasts are changing. This allows you to notice changes early. It can also offer you some reassurance that your breast health is good. One way to learn what is normal for your breasts and whether your breasts are changing is to do a breast self-exam. If you find a lump or something that was not present in the past, it is best to contact your caregiver right away. Other findings that should be evaluated by your caregiver include nipple discharge, especially if it is bloody; skin changes or reddening; areas where the skin seems to be pulled in (retracted); or new lumps and bumps. Breast pain is seldom associated with cancer (malignancy), but should also be evaluated by a caregiver. BREAST SELF-EXAM The best time to examine your breasts is 5 7 days after your menstrual period is over.  ExitCare Patient Information 2013 [employer].   Exercise to Stay Healthy Exercise helps you become and stay healthy. EXERCISE IDEAS AND TIPS Choose exercises that:  You enjoy.  Fit into your day. You do not need to exercise really hard to be healthy. You can do exercises at a slow or medium level and stay healthy. You can:  Stretch before and after working out.  Try yoga, Pilates, or tai chi.  Lift weights.  Walk fast, swim, jog, run, climb stairs, bicycle, dance, or rollerskate.  Take aerobic classes. Exercises that burn about 150 calories:  Running 1  miles in 15 minutes.  Playing volleyball for 45 to 60 minutes.  Washing and waxing a car for 45 to 60  minutes.  Playing touch football for 45 minutes.  Walking 1  miles in 35 minutes.  Pushing a stroller 1  miles in 30 minutes.  Playing basketball for 30 minutes.  Raking leaves for 30 minutes.  Bicycling 5 miles in 30 minutes.  Walking 2 miles in 30 minutes.  Dancing for 30 minutes.  Shoveling snow for 15 minutes.  Swimming laps for 20 minutes.  Walking up stairs for 15 minutes.  Bicycling 4 miles in 15 minutes.  Gardening for 30 to 45 minutes.  Jumping rope for 15 minutes.  Washing windows or floors for 45 to 60 minutes. Document Released: 03/29/2010 Document Revised: 05/19/2011 Document Reviewed: 03/29/2010 ExitCare Patient Information 2013 [employer].   Other topics ( that may be useful information):    Sexually Transmitted Disease Sexually transmitted disease (STD) refers to any infection that is passed from person to person during sexual activity. This may happen by way of saliva, semen, blood, vaginal mucus, or urine. Common STDs include:  Gonorrhea.  Chlamydia.  Syphilis.  HIV/AIDS.  Genital herpes.  Hepatitis B and C.  Trichomonas.  Human papillomavirus (HPV).  Pubic lice. CAUSES  An STD may be spread by bacteria, virus, or parasite. A person can get an STD by:  Sexual intercourse with an infected person.  Sharing sex toys with an infected person.  Sharing needles with an infected person.  Having intimate contact with the genitals, mouth, or rectal areas of an infected person. SYMPTOMS  Some people may not have any symptoms, but   they can still pass the infection to others. Different STDs have different symptoms. Symptoms include:  Painful or bloody urination.  Pain in the pelvis, abdomen, vagina, anus, throat, or eyes.  Skin rash, itching, irritation, growths, or sores (lesions). These usually occur in the genital or anal area.  Abnormal vaginal discharge.  Penile discharge in men.  Soft, flesh-colored skin growths in the  genital or anal area.  Fever.  Pain or bleeding during sexual intercourse.  Swollen glands in the groin area.  Yellow skin and eyes (jaundice). This is seen with hepatitis. DIAGNOSIS  To make a diagnosis, your caregiver may:  Take a medical history.  Perform a physical exam.  Take a specimen (culture) to be examined.  Examine a sample of discharge under a microscope.  Perform blood test TREATMENT   Chlamydia, gonorrhea, trichomonas, and syphilis can be cured with antibiotic medicine.  Genital herpes, hepatitis, and HIV can be treated, but not cured, with prescribed medicines. The medicines will lessen the symptoms.  Genital warts from HPV can be treated with medicine or by freezing, burning (electrocautery), or surgery. Warts may come back.  HPV is a virus and cannot be cured with medicine or surgery.However, abnormal areas may be followed very closely by your caregiver and may be removed from the cervix, vagina, or vulva through office procedures or surgery. If your diagnosis is confirmed, your recent sexual partners need treatment. This is true even if they are symptom-free or have a negative culture or evaluation. They should not have sex until their caregiver says it is okay. HOME CARE INSTRUCTIONS  All sexual partners should be informed, tested, and treated for all STDs.  Take your antibiotics as directed. Finish them even if you start to feel better.  Only take over-the-counter or prescription medicines for pain, discomfort, or fever as directed by your caregiver.  Rest.  Eat a balanced diet and drink enough fluids to keep your urine clear or pale yellow.  Do not have sex until treatment is completed and you have followed up with your caregiver. STDs should be checked after treatment.  Keep all follow-up appointments, Pap tests, and blood tests as directed by your caregiver.  Only use latex condoms and water-soluble lubricants during sexual activity. Do not use  petroleum jelly or oils.  Avoid alcohol and illegal drugs.  Get vaccinated for HPV and hepatitis. If you have not received these vaccines in the past, talk to your caregiver about whether one or both might be right for you.  Avoid risky sex practices that can break the skin. The only way to avoid getting an STD is to avoid all sexual activity.Latex condoms and dental dams (for oral sex) will help lessen the risk of getting an STD, but will not completely eliminate the risk. SEEK MEDICAL CARE IF:   You have a fever.  You have any new or worsening symptoms. Document Released: 05/17/2002 Document Revised: 05/19/2011 Document Reviewed: 05/24/2010 [facility] Patient Information 2013 [place].    Domestic Abuse You are being battered or abused if someone close to you hits, pushes, or physically hurts you in any way. You also are being abused if you are forced into activities. You are being sexually abused if you are forced to have sexual contact of any kind. You are being emotionally abused if you are made to feel worthless or if you are constantly threatened. It is important to remember that help is available. No one has the right to abuse you. PREVENTION OF FURTHER  ABUSE  Learn the warning signs of danger. This varies with situations but may include: the use of alcohol, threats, isolation from friends and family, or forced sexual contact. Leave if you feel that violence is going to occur.  If you are attacked or beaten, report it to the police so the abuse is documented. You do not have to press charges. The police can protect you while you or the attackers are leaving. Get the officer's name and badge number and a copy of the report.  Find someone you can trust and tell them what is happening to you: your caregiver, a nurse, clergy member, close friend or family member. Feeling ashamed is natural, but remember that you have done nothing wrong. No one deserves abuse. Document Released:  02/22/2000 Document Revised: 05/19/2011 Document Reviewed: 05/02/2010 [facility] Patient Information 2013 [place].    How Much is Too Much Alcohol? Drinking too much alcohol can cause injury, accidents, and health problems. These types of problems can include:   Car crashes.  Falls.  Family fighting (domestic violence).  Drowning.  Fights.  Injuries.  Burns.  Damage to certain organs.  Having a baby with birth defects. ONE DRINK CAN BE TOO MUCH WHEN YOU ARE:  Working.  Pregnant or breastfeeding.  Taking medicines. Ask your doctor.  Driving or planning to drive. If you or someone you know has a drinking problem, get help from a doctor.  Document Released: 12/21/2008 Document Revised: 05/19/2011 Document Reviewed: 12/21/2008 [facility] Patient Information 2013 [place].   Smoking Hazards Smoking cigarettes is extremely bad for your health. Tobacco smoke has over 200 known poisons in it. There are over 60 chemicals in tobacco smoke that cause cancer. Some of the chemicals found in cigarette smoke include:   Cyanide.  Benzene.  Formaldehyde.  Methanol (wood alcohol).  Acetylene (fuel used in welding torches).  Ammonia. Cigarette smoke also contains the poisonous gases nitrogen oxide and carbon monoxide.  Cigarette smokers have an increased risk of many serious medical problems and Smoking causes approximately:  90% of all lung cancer deaths in men.  80% of all lung cancer deaths in women.  90% of deaths from chronic obstructive lung disease. Compared with nonsmokers, smoking increases the risk of:  Coronary heart disease by 2 to 4 times.  Stroke by 2 to 4 times.  Men developing lung cancer by 23 times.  Women developing lung cancer by 13 times.  Dying from chronic obstructive lung diseases by 12 times.  . Smoking is the most preventable cause of death and disease in our society.  WHY IS SMOKING ADDICTIVE?  Nicotine is the chemical  agent in tobacco that is capable of causing addiction or dependence.  When you smoke and inhale, nicotine is absorbed rapidly into the bloodstream through your lungs. Nicotine absorbed through the lungs is capable of creating a powerful addiction. Both inhaled and non-inhaled nicotine may be addictive.  Addiction studies of cigarettes and spit tobacco show that addiction to nicotine occurs mainly during the teen years, when young people begin using tobacco products. WHAT ARE THE BENEFITS OF QUITTING?  There are many health benefits to quitting smoking.   Likelihood of developing cancer and heart disease decreases. Health improvements are seen almost immediately.  Blood pressure, pulse rate, and breathing patterns start returning to normal soon after quitting. QUITTING SMOKING   American Lung Association - 1-800-LUNGUSA  American Cancer Society - 1-800-ACS-2345 Document Released: 04/03/2004 Document Revised: 05/19/2011 Document Reviewed: 12/06/2008 [facility] Patient Information 2013 [place].   Stress Management Stress is a state of physical or mental tension that often results from changes in your life or normal routine. Some common causes of stress are:  Death of a loved one.  Injuries or severe illnesses.  Getting fired or changing jobs.  Moving into a new home. Other causes may be:  Sexual problems.  Business or financial losses.  Taking on a large debt.  Regular conflict with someone at home or at work.  Constant tiredness from lack of sleep. It is not just bad things that are stressful. It may be stressful to:  Win the lottery.  Get married.  Buy a new car. The amount of stress that can be easily tolerated varies from person to person. Changes generally cause stress, regardless of the types of change. Too much stress can affect your health. It may lead to physical or emotional problems. Too little stress (boredom) may also become stressful. SUGGESTIONS TO  REDUCE STRESS:  Talk things over with your family and friends. It often is helpful to share your concerns and worries. If you feel your problem is serious, you may want to get help from a professional counselor.  Consider your problems one at a time instead of lumping them all together. Trying to take care of everything at once may seem impossible. List all the things you need to do and then start with the most important one. Set a goal to accomplish 2 or 3 things each day. If you expect to do too many in a single day you will naturally fail, causing you to feel even more stressed.  Do not use alcohol or drugs to relieve stress. Although you may feel better for a short time, they do not remove the problems that caused the stress. They can also be habit forming.  Exercise regularly - at least 3 times per week. Physical exercise can help to relieve that "uptight" feeling and will relax you.  The shortest distance between despair and hope is often a good night's sleep.  Go to bed and get up on time allowing yourself time for appointments without being rushed.  Take a short "time-out" period from any stressful situation that occurs during the day. Close your eyes and take some deep breaths. Starting with the muscles in your face, tense them, hold it for a few seconds, then relax. Repeat this with the muscles in your neck, shoulders, hand, stomach, back and legs.  Take good care of yourself. Eat a balanced diet and get plenty of rest.  Schedule time for having fun. Take a break from your daily routine to relax. HOME CARE INSTRUCTIONS   Call if you feel overwhelmed by your problems and feel you can no longer manage them on your own.  Return immediately if you feel like hurting yourself or someone else. Document Released: 08/20/2000 Document Revised: 05/19/2011 Document Reviewed: 04/12/2007 [facility] Patient Information 2013 [place].

## 2017-12-04 NOTE — Progress Notes (Signed)
27 y.o. G0P0000 Single  Caucasian Fe here for annual exam. Periods none with Depo Provera, happy with choice and plans to continue. No partner change or STD screening needed. Working on better diet and exercise and down 5 pounds. No smoking or soft drinks anymore! No other health issues today.  Took vacation to [place].  No LMP recorded. Patient has had an injection.          Sexually active: Yes.    The current method of family planning is Depo-Provera injections.    Exercising: Yes.    cardio Smoker:  no  Review of Systems  Constitutional: Negative.   HENT:       Headache  Eyes: Negative.   Respiratory: Negative.   Cardiovascular: Negative.   Gastrointestinal: Negative.   Genitourinary: Negative.   Musculoskeletal: Negative.   Skin: Negative.   Neurological: Negative.   Endo/Heme/Allergies: Negative.   Psychiatric/Behavioral: Negative.        Anxiety    Health Maintenance: Pap:  10-30-15 HGSIL, 12-03-16 LGSIL HPV HR +, colpo 11/18 CIN1 History of Abnormal Pap: LEEP MMG:  none Self Breast exams: yes Colonoscopy:  none BMD:   none TDaP:  2016 Shingles: no Pneumonia: no Hep C and HIV: not done Labs: no   reports that she has quit smoking. Her smoking use included cigarettes. She has never used smokeless tobacco. She reports that she has current or past drug history. Drug: Marijuana. She reports that she does not drink alcohol.  Past Medical History:  Diagnosis Date  . Abnormal Pap smear of cervix    2016 ASCUS HPV HR+, 10-30-15 HGSIL, 2018 LGSIL HPV HR+  . Amenorrhea   . Headache(784.0)   . Right distal ulnar fracture 07/20/2015  . Substance abuse (HCC)    previous pain pill addiction    Past Surgical History:  Procedure Laterality Date  . COLPOSCOPY    . GUM SURGERY     age 54  . I&D EXTREMITY Right 07/20/2015   Procedure: IRRIGATION AND DEBRIDEMENT RIGHT ARM;  Surgeon: Mack Hook, MD;  Location: [facility] OR;  Service: Orthopedics;  Laterality: Right;   . LEEP    . ORIF ULNAR FRACTURE Right 07/20/2015   Procedure: OPEN REDUCTION INTERNAL FIXATION (ORIF) RIGHT ULNAR FRACTURE;  Surgeon: Mack Hook, MD;  Location: [facility] OR;  Service: Orthopedics;  Laterality: Right;    Current Outpatient Medications  Medication Sig Dispense Refill  . cetirizine (ZYRTEC) 10 MG tablet Take 10 mg by mouth daily.    . medroxyPROGESTERone (DEPO-PROVERA) 150 MG/ML injection Inject 1 mL (150 mg total) into the muscle every 3 (three) months. 1 mL 4   No current facility-administered medications for this visit.     Family History  Problem Relation Age of Onset  . Aneurysm Mother   . Diabetes Paternal Grandmother   . Glaucoma Paternal Grandmother     ROS:  Pertinent items are noted in HPI.  Otherwise, a comprehensive ROS was negative.  Exam:   BP 104/60   Pulse 68   Resp 16   Ht 5\' 3"  (1.6 m)   Wt 148 lb (67.1 kg)   BMI 26.22 kg/m  Height: 5\' 3"  (160 cm) Ht Readings from Last 3 Encounters:  12/04/17 5\' 3"  (1.6 m)  10/13/17 5\' 3"  (1.6 m)  07/28/17 5' 3.25" (1.607 m)    General appearance: alert, cooperative and appears stated age Head: Normocephalic, without obvious abnormality, atraumatic Neck: no adenopathy, supple, symmetrical, trachea midline and thyroid normal to inspection  and palpation Lungs: clear to auscultation bilaterally Breasts: normal appearance, no masses or tenderness, No nipple retraction or dimpling, No nipple discharge or bleeding, No axillary or supraclavicular adenopathy Heart: regular rate and rhythm Abdomen: soft, non-tender; no masses,  no organomegaly Extremities: extremities normal, atraumatic, no cyanosis or edema Skin: Skin color, texture, turgor normal. No rashes or lesions Lymph nodes: Cervical, supraclavicular, and axillary nodes normal. No abnormal inguinal nodes palpated Neurologic: Grossly normal   Pelvic: External genitalia:  no lesions              Urethra:  normal appearing urethra with no masses,  tenderness or lesions              Bartholin's and Skene's: normal                 Vagina: normal appearing vagina with normal color and discharge, no lesions              Cervix: no lesions, nulliparous appearance and LEEP appearance              Pap taken: Yes.   Bimanual Exam:  Uterus:  normal size, contour, position, consistency, mobility, non-tender              Adnexa: normal adnexa and no mass, fullness, tenderness               Rectovaginal: Confirms               Anus:  normal appearance, no lesions  Chaperone present: yes  A:  Well Woman with normal exam  Contraception Depo Provera desired, no problems with use  Pap smear follow up from LSIL pap and LEEP(2017 for HSIL)  P:   Reviewed health and wellness pertinent to exam  Risks/benefits/expectations with bleeding with Depo Provera discussed desires continuance. Next one due 10/19  Rx Depo Provera 150 mg IM every 3 months x 4  Pap smear: yes If pap smear normal repeat one year, if not per results  counseled on breast self exam, STD prevention, HIV risk factors and prevention, adequate intake of calcium and vitamin D, diet and exercise  return annually or prn  An After Visit Summary was printed and given to the patient.

## 2017-12-09 LAB — CYTOLOGY - PAP
Diagnosis: NEGATIVE
HPV: NOT DETECTED

## 2017-12-22 NOTE — Progress Notes (Signed)
Patient is here for Depo Provera Injection Patient is within Depo Provera Calender Limits last given 10/13/17 Next Depo Due between: Jan 7-21  Last AEX: 12/04/17 DL AEX Scheduled: 78/2/95  Patient is aware when next depo is due  Pt tolerated Injection well in LUOQ  Routed to provider for review, encounter closed.

## 2017-12-29 ENCOUNTER — Ambulatory Visit (INDEPENDENT_AMBULATORY_CARE_PROVIDER_SITE_OTHER): Payer: 59

## 2017-12-29 VITALS — BP 120/62 | HR 66 | Resp 16 | Ht 63.0 in | Wt 147.0 lb

## 2017-12-29 DIAGNOSIS — Z3042 Encounter for surveillance of injectable contraceptive: Secondary | ICD-10-CM | POA: Diagnosis not present

## 2017-12-29 MED ORDER — MEDROXYPROGESTERONE ACETATE 150 MG/ML IM SUSP
150.0000 mg | Freq: Once | INTRAMUSCULAR | Status: AC
  Administered 2017-12-29: 150 mg via INTRAMUSCULAR

## 2018-03-16 ENCOUNTER — Ambulatory Visit (INDEPENDENT_AMBULATORY_CARE_PROVIDER_SITE_OTHER): Payer: 59

## 2018-03-16 VITALS — BP 108/62 | HR 72 | Resp 14 | Ht 63.0 in | Wt 142.0 lb

## 2018-03-16 DIAGNOSIS — Z3042 Encounter for surveillance of injectable contraceptive: Secondary | ICD-10-CM | POA: Diagnosis not present

## 2018-03-16 MED ORDER — MEDROXYPROGESTERONE ACETATE 150 MG/ML IM SUSP
150.0000 mg | Freq: Once | INTRAMUSCULAR | Status: AC
  Administered 2018-03-16: 150 mg via INTRAMUSCULAR

## 2018-03-16 NOTE — Progress Notes (Signed)
Patient is here for Depo Provera Injection Patient is within Depo Provera Calender Limits yes last given 12/29/17 Next Depo Due between: march 25-April 8  Last AEX: 12/04/17 AEX Scheduled: 12/09/2018  Patient is aware when next depo is due  Pt tolerated Injection well RUOQ  Routed to provider for review, encounter closed.

## 2018-07-05 ENCOUNTER — Ambulatory Visit: Payer: 59

## 2018-07-08 ENCOUNTER — Ambulatory Visit (INDEPENDENT_AMBULATORY_CARE_PROVIDER_SITE_OTHER): Payer: 59

## 2018-07-08 ENCOUNTER — Other Ambulatory Visit: Payer: Self-pay

## 2018-07-08 VITALS — BP 102/60 | Temp 99.0°F | Wt 151.0 lb

## 2018-07-08 DIAGNOSIS — Z3042 Encounter for surveillance of injectable contraceptive: Secondary | ICD-10-CM

## 2018-07-08 MED ORDER — MEDROXYPROGESTERONE ACETATE 150 MG/ML IM SUSP
150.0000 mg | Freq: Once | INTRAMUSCULAR | Status: AC
  Administered 2018-07-08: 150 mg via INTRAMUSCULAR

## 2018-07-08 NOTE — Progress Notes (Signed)
Patient is here for Depo Provera Injection Patient is within Depo Provera Calender Limits yes Next Depo Due between: 7/16-7/30 Last AEX: 12-04-17 AEX Scheduled: 12-10-2018  Patient is aware when next depo is due & is scheduled.  Pt tolerated Injection well in LUOQ  Routed to provider for review, encounter closed.

## 2018-09-23 ENCOUNTER — Ambulatory Visit (INDEPENDENT_AMBULATORY_CARE_PROVIDER_SITE_OTHER): Payer: 59

## 2018-09-23 ENCOUNTER — Other Ambulatory Visit: Payer: Self-pay

## 2018-09-23 VITALS — BP 116/60 | HR 81 | Temp 98.5°F | Ht 63.0 in | Wt 142.0 lb

## 2018-09-23 DIAGNOSIS — Z3042 Encounter for surveillance of injectable contraceptive: Secondary | ICD-10-CM

## 2018-09-23 MED ORDER — MEDROXYPROGESTERONE ACETATE 150 MG/ML IM SUSP
150.0000 mg | Freq: Once | INTRAMUSCULAR | Status: AC
  Administered 2018-09-23: 150 mg via INTRAMUSCULAR

## 2018-09-23 NOTE — Progress Notes (Signed)
Patient is here for Depo Provera Injection Patient is within Depo Provera Calender Limits Yes last given July 08 2018 Next Depo Due between: Oct 1 -15  Last AEX: 12/04/17 AEX Scheduled: Will schedule at visit.   Patient is aware when next depo is due  Pt tolerated Injection well in RUOQ   Routed to provider for review, encounter closed.

## 2018-12-09 ENCOUNTER — Ambulatory Visit: Payer: 59 | Admitting: Certified Nurse Midwife

## 2018-12-13 ENCOUNTER — Other Ambulatory Visit (HOSPITAL_COMMUNITY)
Admission: RE | Admit: 2018-12-13 | Discharge: 2018-12-13 | Disposition: A | Discharge status: Home / Self Care | Payer: 59 | Source: Ambulatory Visit | Attending: Certified Nurse Midwife | Admitting: Certified Nurse Midwife

## 2018-12-13 ENCOUNTER — Other Ambulatory Visit: Payer: Self-pay

## 2018-12-13 ENCOUNTER — Encounter: Payer: Self-pay | Admitting: Certified Nurse Midwife

## 2018-12-13 ENCOUNTER — Ambulatory Visit: Payer: 59 | Admitting: Certified Nurse Midwife

## 2018-12-13 VITALS — BP 110/60 | HR 68 | Temp 97.1°F | Resp 16 | Ht 63.25 in | Wt 143.0 lb

## 2018-12-13 DIAGNOSIS — Z124 Encounter for screening for malignant neoplasm of cervix: Secondary | ICD-10-CM | POA: Insufficient documentation

## 2018-12-13 DIAGNOSIS — Z01419 Encounter for gynecological examination (general) (routine) without abnormal findings: Secondary | ICD-10-CM | POA: Diagnosis not present

## 2018-12-13 DIAGNOSIS — Z113 Encounter for screening for infections with a predominantly sexual mode of transmission: Secondary | ICD-10-CM | POA: Diagnosis present

## 2018-12-13 DIAGNOSIS — Z8742 Personal history of other diseases of the female genital tract: Secondary | ICD-10-CM

## 2018-12-13 DIAGNOSIS — Z3042 Encounter for surveillance of injectable contraceptive: Secondary | ICD-10-CM | POA: Diagnosis not present

## 2018-12-13 DIAGNOSIS — Z9889 Other specified postprocedural states: Secondary | ICD-10-CM | POA: Diagnosis not present

## 2018-12-13 MED ORDER — MEDROXYPROGESTERONE ACETATE 150 MG/ML IM SUSP
150.0000 mg | Freq: Once | INTRAMUSCULAR | Status: AC
  Administered 2018-12-13: 150 mg via INTRAMUSCULAR

## 2018-12-13 NOTE — Patient Instructions (Signed)
General topics  Next pap or exam is  due in 1 year Take a Women's multivitamin Take 1200 mg. of calcium daily - prefer dietary If any concerns in interim to call back  Breast Self-Awareness Practicing breast self-awareness may pick up problems early, prevent significant medical complications, and possibly save your life. By practicing breast self-awareness, you can become familiar with how your breasts look and feel and if your breasts are changing. This allows you to notice changes early. It can also offer you some reassurance that your breast health is good. One way to learn what is normal for your breasts and whether your breasts are changing is to do a breast self-exam. If you find a lump or something that was not present in the past, it is best to contact your caregiver right away. Other findings that should be evaluated by your caregiver include nipple discharge, especially if it is bloody; skin changes or reddening; areas where the skin seems to be pulled in (retracted); or new lumps and bumps. Breast pain is seldom associated with cancer (malignancy), but should also be evaluated by a caregiver. BREAST SELF-EXAM The best time to examine your breasts is 5 7 days after your menstrual period is over.  ExitCare Patient Information 2013 [place].   Exercise to Stay Healthy Exercise helps you become and stay healthy. EXERCISE IDEAS AND TIPS Choose exercises that:  You enjoy.  Fit into your day. You do not need to exercise really hard to be healthy. You can do exercises at a slow or medium level and stay healthy. You can:  Stretch before and after working out.  Try yoga, Pilates, or tai chi.  Lift weights.  Walk fast, swim, jog, run, climb stairs, bicycle, dance, or rollerskate.  Take aerobic classes. Exercises that burn about 150 calories:  Running 1  miles in 15 minutes.  Playing volleyball for 45 to 60 minutes.  Washing and waxing a car for 45 to 60  minutes.  Playing touch football for 45 minutes.  Walking 1  miles in 35 minutes.  Pushing a stroller 1  miles in 30 minutes.  Playing basketball for 30 minutes.  Raking leaves for 30 minutes.  Bicycling 5 miles in 30 minutes.  Walking 2 miles in 30 minutes.  Dancing for 30 minutes.  Shoveling snow for 15 minutes.  Swimming laps for 20 minutes.  Walking up stairs for 15 minutes.  Bicycling 4 miles in 15 minutes.  Gardening for 30 to 45 minutes.  Jumping rope for 15 minutes.  Washing windows or floors for 45 to 60 minutes. Document Released: 03/29/2010 Document Revised: 05/19/2011 Document Reviewed: 03/29/2010 [facility] Patient Information 2013 [place].   Other topics ( that may be useful information):    Sexually Transmitted Disease Sexually transmitted disease (STD) refers to any infection that is passed from person to person during sexual activity. This may happen by way of saliva, semen, blood, vaginal mucus, or urine. Common STDs include:  Gonorrhea.  Chlamydia.  Syphilis.  HIV/AIDS.  Genital herpes.  Hepatitis B and C.  Trichomonas.  Human papillomavirus (HPV).  Pubic lice. CAUSES  An STD may be spread by bacteria, virus, or parasite. A person can get an STD by:  Sexual intercourse with an infected person.  Sharing sex toys with an infected person.  Sharing needles with an infected person.  Having intimate contact with the genitals, mouth, or rectal areas of an infected person. SYMPTOMS  Some people may  they can still pass the infection to others. Different STDs have different symptoms. Symptoms include: °· Painful or bloody urination. °· Pain in the pelvis, abdomen, vagina, anus, throat, or eyes. °· Skin rash, itching, irritation, growths, or sores (lesions). These usually occur in the genital or anal area. °· Abnormal vaginal discharge. °· Penile discharge in men. °· Soft, flesh-colored skin growths in the  genital or anal area. °· Fever. °· Pain or bleeding during sexual intercourse. °· Swollen glands in the groin area. °· Yellow skin and eyes (jaundice). This is seen with hepatitis. °DIAGNOSIS  °To make a diagnosis, your caregiver may: °· Take a medical history. °· Perform a physical exam. °· Take a specimen (culture) to be examined. °· Examine a sample of discharge under a microscope. °· Perform blood test °TREATMENT  °· Chlamydia, gonorrhea, trichomonas, and syphilis can be cured with antibiotic medicine. °· Genital herpes, hepatitis, and HIV can be treated, but not cured, with prescribed medicines. The medicines will lessen the symptoms. °· Genital warts from HPV can be treated with medicine or by freezing, burning (electrocautery), or surgery. Warts may come back. °· HPV is a virus and cannot be cured with medicine or surgery. However, abnormal areas may be followed very closely by your caregiver and may be removed from the cervix, vagina, or vulva through office procedures or surgery. °If your diagnosis is confirmed, your recent sexual partners need treatment. This is true even if they are symptom-free or have a negative culture or evaluation. They should not have sex until their caregiver says it is okay. °HOME CARE INSTRUCTIONS °· All sexual partners should be informed, tested, and treated for all STDs. °· Take your antibiotics as directed. Finish them even if you start to feel better. °· Only take over-the-counter or prescription medicines for pain, discomfort, or fever as directed by your caregiver. °· Rest. °· Eat a balanced diet and drink enough fluids to keep your urine clear or pale yellow. °· Do not have sex until treatment is completed and you have followed up with your caregiver. STDs should be checked after treatment. °· Keep all follow-up appointments, Pap tests, and blood tests as directed by your caregiver. °· Only use latex condoms and water-soluble lubricants during sexual activity. Do not use  petroleum jelly or oils. °· Avoid alcohol and illegal drugs. °· Get vaccinated for HPV and hepatitis. If you have not received these vaccines in the past, talk to your caregiver about whether one or both might be right for you. °· Avoid risky sex practices that can break the skin. °The only way to avoid getting an STD is to avoid all sexual activity. Latex condoms and dental dams (for oral sex) will help lessen the risk of getting an STD, but will not completely eliminate the risk. °SEEK MEDICAL CARE IF:  °· You have a fever. °· You have any new or worsening symptoms. °Document Released: 05/17/2002 Document Revised: 05/19/2011 Document Reviewed: 05/24/2010 °ExitCare® Patient Information ©2013 [employer]. ° ° ° °Domestic Abuse °You are being battered or abused if someone close to you hits, pushes, or physically hurts you in any way. You also are being abused if you are forced into activities. You are being sexually abused if you are forced to have sexual contact of any kind. You are being emotionally abused if you are made to feel worthless or if you are constantly threatened. It is important to remember that help is available. No one has the right to abuse you. °PREVENTION OF FURTHER   abuse you. PREVENTION OF FURTHER ABUSE  Learn the warning signs of danger. This varies with situations but may include: the use of alcohol, threats, isolation from friends and family, or forced sexual contact. Leave if you feel that violence is going to occur.  If you are attacked or beaten, report it to the police so the abuse is documented. You do not have to press charges. The police can protect you while you or the attackers are leaving. Get the officer's name and badge number and a copy of the report.  Find someone you can trust and tell them what is happening to you: your caregiver, a nurse, clergy member, close friend or family member. Feeling ashamed is natural, but remember that you have done nothing wrong. No one deserves abuse. Document Released:  02/22/2000 Document Revised: 05/19/2011 Document Reviewed: 05/02/2010 [facility] Patient Information 2013 [place].    How Much is Too Much Alcohol? Drinking too much alcohol can cause injury, accidents, and health problems. These types of problems can include:   Car crashes.  Falls.  Family fighting (domestic violence).  Drowning.  Fights.  Injuries.  Burns.  Damage to certain organs.  Having a baby with birth defects. ONE DRINK CAN BE TOO MUCH WHEN YOU ARE:  Working.  Pregnant or breastfeeding.  Taking medicines. Ask your doctor.  Driving or planning to drive. If you or someone you know has a drinking problem, get help from a doctor.  Document Released: 12/21/2008 Document Revised: 05/19/2011 Document Reviewed: 12/21/2008 [facility] Patient Information 2013 [place].   Smoking Hazards Smoking cigarettes is extremely bad for your health. Tobacco smoke has over 200 known poisons in it. There are over 60 chemicals in tobacco smoke that cause cancer. Some of the chemicals found in cigarette smoke include:   Cyanide.  Benzene.  Formaldehyde.  Methanol (wood alcohol).  Acetylene (fuel used in welding torches).  Ammonia. Cigarette smoke also contains the poisonous gases nitrogen oxide and carbon monoxide.  Cigarette smokers have an increased risk of many serious medical problems and Smoking causes approximately:  90% of all lung cancer deaths in men.  80% of all lung cancer deaths in women.  90% of deaths from chronic obstructive lung disease. Compared with nonsmokers, smoking increases the risk of:  Coronary heart disease by 2 to 4 times.  Stroke by 2 to 4 times.  Men developing lung cancer by 23 times.  Women developing lung cancer by 13 times.  Dying from chronic obstructive lung diseases by 12 times.  . Smoking is the most preventable cause of death and disease in our society.  WHY IS SMOKING ADDICTIVE?  Nicotine is the chemical  agent in tobacco that is capable of causing addiction or dependence.  When you smoke and inhale, nicotine is absorbed rapidly into the bloodstream through your lungs. Nicotine absorbed through the lungs is capable of creating a powerful addiction. Both inhaled and non-inhaled nicotine may be addictive.  Addiction studies of cigarettes and spit tobacco show that addiction to nicotine occurs mainly during the teen years, when young people begin using tobacco products. WHAT ARE THE BENEFITS OF QUITTING?  There are many health benefits to quitting smoking.   Likelihood of developing cancer and heart disease decreases. Health improvements are seen almost immediately.  Blood pressure, pulse rate, and breathing patterns start returning to normal soon after quitting. QUITTING SMOKING   American Lung Association - 1-800-LUNGUSA  American Cancer Society - 1-800-ACS-2345 Document Released: 04/03/2004 Document Revised: 05/19/2011 Document Reviewed: 12/06/2008  LLC.   Stress Management Stress is a state of physical or mental tension that often results from changes in your life or normal routine. Some common causes of stress are:  Death of a loved one.  Injuries or severe illnesses.  Getting fired or changing jobs.  Moving into a new home. Other causes may be:  Sexual problems.  Business or financial losses.  Taking on a large debt.  Regular conflict with someone at home or at work.  Constant tiredness from lack of sleep. It is not just bad things that are stressful. It may be stressful to:  Win the lottery.  Get married.  Buy a new car. The amount of stress that can be easily tolerated varies from person to person. Changes generally cause stress, regardless of the types of change. Too much stress can affect your health. It may lead to physical or emotional problems. Too little stress (boredom) may also become stressful. SUGGESTIONS TO  REDUCE STRESS:  Talk things over with your family and friends. It often is helpful to share your concerns and worries. If you feel your problem is serious, you may want to get help from a professional counselor.  Consider your problems one at a time instead of lumping them all together. Trying to take care of everything at once may seem impossible. List all the things you need to do and then start with the most important one. Set a goal to accomplish 2 or 3 things each day. If you expect to do too many in a single day you will naturally fail, causing you to feel even more stressed.  Do not use alcohol or drugs to relieve stress. Although you may feel better for a short time, they do not remove the problems that caused the stress. They can also be habit forming.  Exercise regularly - at least 3 times per week. Physical exercise can help to relieve that "uptight" feeling and will relax you.  The shortest distance between despair and hope is often a good night's sleep.  Go to bed and get up on time allowing yourself time for appointments without being rushed.  Take a short "time-out" period from any stressful situation that occurs during the day. Close your eyes and take some deep breaths. Starting with the muscles in your face, tense them, hold it for a few seconds, then relax. Repeat this with the muscles in your neck, shoulders, hand, stomach, back and legs.  Take good care of yourself. Eat a balanced diet and get plenty of rest.  Schedule time for having fun. Take a break from your daily routine to relax. HOME CARE INSTRUCTIONS   Call if you feel overwhelmed by your problems and feel you can no longer manage them on your own.  Return immediately if you feel like hurting yourself or someone else. Document Released: 08/20/2000 Document Revised: 05/19/2011 Document Reviewed: 04/12/2007 Exit[employer] Patient Information 2013 [employer].  Medroxyprogesterone injection [Contraceptive] What is  this medicine? MEDROXYPROGESTERONE (me DROX ee proe JES te rone) contraceptive injections prevent pregnancy. They provide effective birth control for 3 months. Depo-subQ Provera 104 is also used for treating pain related to endometriosis. This medicine may be used for other purposes; ask your health care provider or pharmacist if you have questions. COMMON BRAND NAME(S): Depo-Provera, Depo-subQ Provera 104 What should I tell my health care provider before I take this medicine? They need to know if you have any of these conditions:  frequently drink alcohol  asthma  blood   vessel disease or a history of a blood clot in the lungs or legs  bone disease such as osteoporosis  breast cancer  diabetes  eating disorder (anorexia nervosa or bulimia)  high blood pressure  HIV infection or AIDS  kidney disease  liver disease  mental depression  migraine  seizures (convulsions)  stroke  tobacco smoker  vaginal bleeding  an unusual or allergic reaction to medroxyprogesterone, other hormones, medicines, foods, dyes, or preservatives  pregnant or trying to get pregnant  breast-feeding How should I use this medicine? Depo-Provera Contraceptive injection is given into a muscle. Depo-subQ Provera 104 injection is given under the skin. These injections are given by a health care professional. You must not be pregnant before getting an injection. The injection is usually given during the first 5 days after the start of a menstrual period or 6 weeks after delivery of a baby. Talk to your pediatrician regarding the use of this medicine in children. Special care may be needed. These injections have been used in female children who have started having menstrual periods. Overdosage: If you think you have taken too much of this medicine contact a poison control center or emergency room at once. NOTE: This medicine is only for you. Do not share this medicine with others. What if I miss a  dose? Try not to miss a dose. You must get an injection once every 3 months to maintain birth control. If you cannot keep an appointment, call and reschedule it. If you wait longer than 13 weeks between Depo-Provera contraceptive injections or longer than 14 weeks between Depo-subQ Provera 104 injections, you could get pregnant. Use another method for birth control if you miss your appointment. You may also need a pregnancy test before receiving another injection. What may interact with this medicine? Do not take this medicine with any of the following medications:  bosentan This medicine may also interact with the following medications:  aminoglutethimide  antibiotics or medicines for infections, especially rifampin, rifabutin, rifapentine, and griseofulvin  aprepitant  barbiturate medicines such as phenobarbital or primidone  bexarotene  carbamazepine  medicines for seizures like ethotoin, felbamate, oxcarbazepine, phenytoin, topiramate  modafinil  St. John's wort This list may not describe all possible interactions. Give your health care provider a list of all the medicines, herbs, non-prescription drugs, or dietary supplements you use. Also tell them if you smoke, drink alcohol, or use illegal drugs. Some items may interact with your medicine. What should I watch for while using this medicine? This drug does not protect you against HIV infection (AIDS) or other sexually transmitted diseases. Use of this product may cause you to lose calcium from your bones. Loss of calcium may cause weak bones (osteoporosis). Only use this product for more than 2 years if other forms of birth control are not right for you. The longer you use this product for birth control the more likely you will be at risk for weak bones. Ask your health care professional how you can keep strong bones. You may have a change in bleeding pattern or irregular periods. Many females stop having periods while taking this  drug. If you have received your injections on time, your chance of being pregnant is very low. If you think you may be pregnant, see your health care professional as soon as possible. Tell your health care professional if you want to get pregnant within the next year. The effect of this medicine may last a long time after you get your last   injection. What side effects may I notice from receiving this medicine? Side effects that you should report to your doctor or health care professional as soon as possible:  allergic reactions like skin rash, itching or hives, swelling of the face, lips, or tongue  breast tenderness or discharge  breathing problems  changes in vision  depression  feeling faint or lightheaded, falls  fever  pain in the abdomen, chest, groin, or leg  problems with balance, talking, walking  unusually weak or tired  yellowing of the eyes or skin Side effects that usually do not require medical attention (report to your doctor or health care professional if they continue or are bothersome):  acne  fluid retention and swelling  headache  irregular periods, spotting, or absent periods  temporary pain, itching, or skin reaction at site where injected  weight gain This list may not describe all possible side effects. Call your doctor for medical advice about side effects. You may report side effects to FDA at 1-800-FDA-1088. Where should I keep my medicine? This does not apply. The injection will be given to you by a health care professional. NOTE: This sheet is a summary. It may not cover all possible information. If you have questions about this medicine, talk to your doctor, pharmacist, or health care provider.  2020 Elsevier/Gold Standard (2008-03-17 18:37:56)  

## 2018-12-13 NOTE — Progress Notes (Signed)
28 y.o. G0P0000 Single  Caucasian Fe here for annual exam. Contraception Depo Provera working well, no weight change and no period or spotting. No partner change since last visit. Sees Dr Hal Hope as needed, no visits this year. No STD concern would like screening with pap smear. No other health issues today.  No LMP recorded. Patient has had an injection.          Sexually active: Yes.    The current method of family planning is Depo-Provera injections.    Exercising: Yes.    walking Smoker:  no  Review of Systems  Constitutional: Negative.   HENT: Negative.   Eyes: Negative.   Respiratory: Negative.   Cardiovascular: Negative.   Gastrointestinal: Negative.   Genitourinary: Negative.   Musculoskeletal: Negative.   Skin: Negative.   Neurological: Negative.   Endo/Heme/Allergies: Negative.   Psychiatric/Behavioral: Negative.     Health Maintenance: Pap:  12-04-17 neg HPV HR neg,12-03-16 LGSIL HPV HR + History of Abnormal Pap: yes CIN1 MMG:  none Self Breast exams: yes Colonoscopy:  none BMD:   none TDaP:  2016 Shingles: no Pneumonia: no Hep C and HIV: not done Labs: yes   reports that she has quit smoking. Her smoking use included cigarettes. She has never used smokeless tobacco. She reports current drug use. Drug: Marijuana. She reports that she does not drink alcohol.  Past Medical History:  Diagnosis Date  . Abnormal Pap smear of cervix    2016 ASCUS HPV HR+, 10-30-15 HGSIL, 2018 LGSIL HPV HR+  . Amenorrhea   . Headache(784.0)   . Right distal ulnar fracture 07/20/2015  . Substance abuse (HCC)    previous pain pill addiction    Past Surgical History:  Procedure Laterality Date  . COLPOSCOPY    . GUM SURGERY     age 74  . I&D EXTREMITY Right 07/20/2015   Procedure: IRRIGATION AND DEBRIDEMENT RIGHT ARM;  Surgeon: Mack Hook, MD;  Location: [facility] OR;  Service: Orthopedics;  Laterality: Right;  . LEEP    . ORIF ULNAR FRACTURE Right 07/20/2015   Procedure: OPEN  REDUCTION INTERNAL FIXATION (ORIF) RIGHT ULNAR FRACTURE;  Surgeon: Mack Hook, MD;  Location: [facility] OR;  Service: Orthopedics;  Laterality: Right;    Current Outpatient Medications  Medication Sig Dispense Refill  . cetirizine (ZYRTEC) 10 MG tablet Take 10 mg by mouth daily.    . medroxyPROGESTERone (DEPO-PROVERA) 150 MG/ML injection Inject 1 mL (150 mg total) into the muscle every 3 (three) months. 1 mL 4   No current facility-administered medications for this visit.     Family History  Problem Relation Age of Onset  . Aneurysm Mother   . Diabetes Paternal Grandmother   . Glaucoma Paternal Grandmother     ROS:  Pertinent items are noted in HPI.  Otherwise, a comprehensive ROS was negative.  Exam:   BP 110/60   Pulse 68   Temp (!) 97.1 F (36.2 C) (Skin)   Resp 16   Ht 5' 3.25" (1.607 m)   Wt 143 lb (64.9 kg)   BMI 25.13 kg/m  Height: 5' 3.25" (160.7 cm) Ht Readings from Last 3 Encounters:  12/13/18 5' 3.25" (1.607 m)  09/23/18 5\' 3"  (1.6 m)  03/16/18 5\' 3"  (1.6 m)    General appearance: alert, cooperative and appears stated age Head: Normocephalic, without obvious abnormality, atraumatic Neck: no adenopathy, supple, symmetrical, trachea midline and thyroid normal to inspection and palpation Lungs: clear to auscultation bilaterally Breasts: normal appearance, no masses  or tenderness, No nipple retraction or dimpling, No nipple discharge or bleeding, No axillary or supraclavicular adenopathy Heart: regular rate and rhythm Abdomen: soft, non-tender; no masses,  no organomegaly Extremities: extremities normal, atraumatic, no cyanosis or edema Skin: Skin color, texture, turgor normal. No rashes or lesions, tattoos present Lymph nodes: Cervical, supraclavicular, and axillary nodes normal. No abnormal inguinal nodes palpated Neurologic: Grossly normal   Pelvic: External genitalia:  no lesions              Urethra:  normal appearing urethra with no masses, tenderness or  lesions              Bartholin's and Skene's: normal                 Vagina: normal appearing vagina with normal color and discharge, no lesions              Cervix: no cervical motion tenderness, no lesions, nulliparous appearance and LEEP appearance              Pap taken: Yes.   Bimanual Exam:  Uterus:  normal size, contour, position, consistency, mobility, non-tender and anteverted              Adnexa: normal adnexa and no mass, fullness, tenderness               Rectovaginal: Confirms               Anus:  normal appearance no lesions  Chaperone present: yes  A:  Well Woman with normal exam  Contraception Depo Provera desired  STD screening  History of [place] for HGSIL pap  P:   Reviewed health and wellness pertinent to exam  Risks/benefits/warning signs and watching weight reviewed. Desires continuance.  Rx Depo Provera 150 mg IM every 3 months x 1 year. Depo Provera given. Pt is aware next injection is due 02/28/2019 - 03/13/2019. Appt scheduled.  Pap smear: yes   counseled on breast self exam, STD prevention, HIV risk factors and prevention, feminine hygiene, adequate intake of calcium and vitamin D, diet and exercise  return annually or prn  An After Visit Summary was printed and given to the patient.

## 2018-12-20 ENCOUNTER — Telehealth: Payer: Self-pay | Admitting: *Deleted

## 2018-12-20 NOTE — Telephone Encounter (Signed)
Spoke with patient, advised per Melvia Heaps, CNM. Patient is aware she will be notified of final results and recommendations once completed. Patient verbalizes understanding and is agreeable.   Notes recorded by Regina Eck, CNM on 12/20/2018 at 8:14 AM EDT  Notify patient her pap smear was negative, HPV present. Waiting on HPV typing to determine if further follow up needed.  Gonorrhea, Chlamydia, and trichomonas negative   Encounter closed.

## 2018-12-20 NOTE — Telephone Encounter (Signed)
Message left to return call to Triage Nurse at [phone].    

## 2018-12-24 LAB — CYTOLOGY - PAP
Chlamydia: NEGATIVE
Comment: NEGATIVE
Diagnosis: NEGATIVE
HPV 16: NEGATIVE
HPV 18 / 45: NEGATIVE
High risk HPV: POSITIVE — AB
Neisseria Gonorrhea: NEGATIVE
Trichomonas: NEGATIVE

## 2018-12-29 ENCOUNTER — Telehealth: Payer: Self-pay

## 2018-12-29 NOTE — Telephone Encounter (Signed)
Left message for call back.

## 2018-12-29 NOTE — Telephone Encounter (Signed)
Patient notified of results. See lab 

## 2018-12-29 NOTE — Telephone Encounter (Signed)
-----   Message from Regina Eck, CNM sent at 12/28/2018  9:04 PM EDT ----- Notify patient that her HPV typing was negative and she does not need any further follow up now. Repeat pap with HPV in one year 08 recall

## 2019-02-28 ENCOUNTER — Ambulatory Visit: Payer: 59

## 2019-03-02 ENCOUNTER — Other Ambulatory Visit: Payer: Self-pay

## 2019-03-02 ENCOUNTER — Ambulatory Visit (INDEPENDENT_AMBULATORY_CARE_PROVIDER_SITE_OTHER): Payer: 59

## 2019-03-02 VITALS — BP 110/70 | HR 70 | Temp 99.0°F | Ht 63.25 in | Wt 148.0 lb

## 2019-03-02 DIAGNOSIS — Z3042 Encounter for surveillance of injectable contraceptive: Secondary | ICD-10-CM

## 2019-03-02 MED ORDER — MEDROXYPROGESTERONE ACETATE 150 MG/ML IM SUSP: 150.0000 mg | Freq: Once | INTRAMUSCULAR | 0 refills | Status: DC

## 2019-03-02 MED ORDER — MEDROXYPROGESTERONE ACETATE 150 MG/ML IM SUSP
150.0000 mg | Freq: Once | INTRAMUSCULAR | Status: AC
  Administered 2019-03-02: 150 mg via INTRAMUSCULAR

## 2019-03-02 NOTE — Progress Notes (Signed)
Patient is here for Depo Provera Injection Patient is within Depo Provera Calender Limits yes Next Depo Due between: 3/10-3/24 Last AEX: 12/13/18 DL AEX Scheduled: 12/14/19  Patient is aware when next depo is due  Pt tolerated Injection well in [place].  Routed to provider for review, encounter closed.

## 2019-05-18 ENCOUNTER — Ambulatory Visit: Payer: 59

## 2019-05-23 ENCOUNTER — Encounter: Payer: Self-pay | Admitting: Certified Nurse Midwife

## 2019-05-25 ENCOUNTER — Other Ambulatory Visit: Payer: Self-pay

## 2019-05-25 ENCOUNTER — Ambulatory Visit (INDEPENDENT_AMBULATORY_CARE_PROVIDER_SITE_OTHER): Payer: BC Managed Care – PPO

## 2019-05-25 ENCOUNTER — Encounter: Payer: Self-pay | Admitting: Certified Nurse Midwife

## 2019-05-25 DIAGNOSIS — Z3042 Encounter for surveillance of injectable contraceptive: Secondary | ICD-10-CM | POA: Diagnosis not present

## 2019-05-25 MED ORDER — MEDROXYPROGESTERONE ACETATE 150 MG/ML IM SUSP
150.0000 mg | Freq: Once | INTRAMUSCULAR | Status: AC
  Administered 2019-05-25: 150 mg via INTRAMUSCULAR

## 2019-05-25 NOTE — Progress Notes (Signed)
Patient is here for Depo Provera Injection Patient is within Depo Provera Calender Limits 05/18/2019- 06/01/2019 Next Depo Due between: 08/10/2019-08/24/2019 Last AEX: 12/13/2018 AEX Scheduled: 12/14/2019  Patient is aware when next depo is due  Pt tolerated Injection well in RUOQ  Routed to provider for review, encounter closed.

## 2019-08-17 ENCOUNTER — Ambulatory Visit (INDEPENDENT_AMBULATORY_CARE_PROVIDER_SITE_OTHER): Payer: BC Managed Care – PPO | Admitting: *Deleted

## 2019-08-17 ENCOUNTER — Other Ambulatory Visit: Payer: Self-pay

## 2019-08-17 VITALS — BP 110/66 | HR 62 | Temp 98.4°F | Resp 14 | Ht 63.0 in | Wt 158.0 lb

## 2019-08-17 DIAGNOSIS — Z3042 Encounter for surveillance of injectable contraceptive: Secondary | ICD-10-CM | POA: Diagnosis not present

## 2019-08-17 MED ORDER — MEDROXYPROGESTERONE ACETATE 150 MG/ML IM SUSP
150.0000 mg | Freq: Once | INTRAMUSCULAR | Status: AC
  Administered 2019-08-17: 150 mg via INTRAMUSCULAR

## 2019-08-17 NOTE — Progress Notes (Signed)
Patient is here for Depo Provera Injection Patient is within Depo Provera Calender Limits yes, last given 05-25-19  Next Depo Due between: 8/25 to 9/8  Last AEX: 12-13-2018 DL  AEX Scheduled: 08-0-22 JJ   Patient is aware when next depo is due  Pt tolerated Injection well in [place].  Routed to provider for review, encounter closed.

## 2019-11-02 ENCOUNTER — Ambulatory Visit (INDEPENDENT_AMBULATORY_CARE_PROVIDER_SITE_OTHER): Payer: BC Managed Care – PPO

## 2019-11-02 ENCOUNTER — Other Ambulatory Visit: Payer: Self-pay

## 2019-11-02 ENCOUNTER — Ambulatory Visit: Payer: BC Managed Care – PPO

## 2019-11-02 VITALS — BP 124/80 | HR 91 | Resp 12 | Ht 66.0 in | Wt 158.0 lb

## 2019-11-02 DIAGNOSIS — Z3042 Encounter for surveillance of injectable contraceptive: Secondary | ICD-10-CM | POA: Diagnosis not present

## 2019-11-02 MED ORDER — MEDROXYPROGESTERONE ACETATE 150 MG/ML IM SUSP
150.0000 mg | Freq: Once | INTRAMUSCULAR | Status: AC
  Administered 2019-11-02: 150 mg via INTRAMUSCULAR

## 2019-11-02 NOTE — Progress Notes (Signed)
Patient is here for Depo Provera Injection Patient is within Depo Provera Calender Limits - yes Next Depo Due between: Nov 10th - 24th Last AEX: 12/13/18 AEX Scheduled: 12/15/19  Patient is aware when next depo is due  Pt tolerated Injection well in LUOQ  Routed to provider for review, encounter closed.

## 2019-12-14 ENCOUNTER — Ambulatory Visit: Payer: 59 | Admitting: Certified Nurse Midwife

## 2019-12-15 ENCOUNTER — Ambulatory Visit: Payer: BC Managed Care – PPO | Admitting: Obstetrics and Gynecology

## 2020-01-18 ENCOUNTER — Ambulatory Visit: Payer: BC Managed Care – PPO

## 2020-01-25 ENCOUNTER — Other Ambulatory Visit: Payer: Self-pay

## 2020-01-25 ENCOUNTER — Ambulatory Visit (INDEPENDENT_AMBULATORY_CARE_PROVIDER_SITE_OTHER): Payer: BC Managed Care – PPO

## 2020-01-25 VITALS — BP 112/60 | HR 72 | Resp 16 | Ht 63.0 in | Wt 158.4 lb

## 2020-01-25 DIAGNOSIS — Z3042 Encounter for surveillance of injectable contraceptive: Secondary | ICD-10-CM

## 2020-01-25 MED ORDER — MEDROXYPROGESTERONE ACETATE 150 MG/ML IM SUSP
150.0000 mg | Freq: Once | INTRAMUSCULAR | Status: AC
  Administered 2020-01-25: 150 mg via INTRAMUSCULAR

## 2020-01-25 NOTE — Progress Notes (Signed)
Patient is here for Depo Provera Injection Patient is within Depo Provera Calender Limits Yes Next Depo Due between: 2/2-2/16 Last AEX: 12/13/18 DL AEX Scheduled: none  Patient is aware when next depo is due  Pt tolerated Injection well in RUOQ.  Routed to provider for review, encounter closed.

## 2020-04-18 ENCOUNTER — Ambulatory Visit: Payer: BC Managed Care – PPO

## 2020-04-24 ENCOUNTER — Telehealth: Payer: Self-pay | Admitting: *Deleted

## 2020-04-24 NOTE — Telephone Encounter (Signed)
Spoke with patient. Patient request to keep depo provera appt as scheduled for 04/25/20.   Advised patient she is overdue for AEX, last AEX 12/09/18 w/ DL. Advised patient she will need updated AEX to continue depo provera. Patient declines to schedule AEX at this time, states she needs her schedule from work. Advised I will review with provider and f/u with any additional recommendations. Patient verbalizes understanding and is agreeable.   Dr. Oscar La -please review and advise. Patient was already scheduled for depo-provera injection, has not been seen in office since 12/09/18. Please advise.

## 2020-04-24 NOTE — Telephone Encounter (Signed)
Call returned to patient to reschedule depo injection.  Next depo due 04/11/20 -04/25/20.   Last AEX 12/09/18 w/ Leota Sauers, CNM.  Patient needs updated AEX.

## 2020-04-25 ENCOUNTER — Other Ambulatory Visit: Payer: Self-pay

## 2020-04-25 ENCOUNTER — Ambulatory Visit (INDEPENDENT_AMBULATORY_CARE_PROVIDER_SITE_OTHER): Payer: BC Managed Care – PPO | Admitting: *Deleted

## 2020-04-25 DIAGNOSIS — Z3042 Encounter for surveillance of injectable contraceptive: Secondary | ICD-10-CM | POA: Diagnosis not present

## 2020-04-25 MED ORDER — MEDROXYPROGESTERONE ACETATE 150 MG/ML IM SUSP
150.0000 mg | Freq: Once | INTRAMUSCULAR | Status: AC
  Administered 2020-04-25: 150 mg via INTRAMUSCULAR

## 2020-04-25 NOTE — Progress Notes (Signed)
Patient is here for Depo Provera Injection Patient is within Depo Provera Calender Limits yes last given 01/25/20 Next Depo Due between:5/4/-5/18  Last AEX: 12/13/18 AEX Scheduled: none   Patient is aware when next depo is due  Pt tolerated Injection well.  Routed to provider for review, encounter closed.

## 2020-04-25 NOTE — Telephone Encounter (Signed)
I would just recommend that she schedule an annual visit prior to her next shot.

## 2020-04-25 NOTE — Telephone Encounter (Signed)
Per review of Epic, patient scheduled AEX for 07/19/20.   Encounter closed.

## 2020-07-17 NOTE — Progress Notes (Deleted)
30 y.o. G0P0000 Single White or Caucasian Not Hispanic or Latino female here for annual exam.      No LMP recorded. Patient has had an injection.          Sexually active: {yes no:314532}  The current method of family planning is {contraception:315051}.    Exercising: {yes no:314532}  {types:19826} Smoker:  {YES NO:22349}  Health Maintenance: Pap:12/13/2018 WNL HR HPV + 16,18,45 neg,  12-04-17 neg HPV HR neg,12-03-16 LGSIL HPV HR + History of abnormal Pap:  yes MMG:  None  BMD:   None  Colonoscopy: none TDaP:  2016 Gardasil: ***   reports that she has quit smoking. Her smoking use included cigarettes. She has never used smokeless tobacco. She reports current drug use. Drug: Marijuana. She reports that she does not drink alcohol.  Past Medical History:  Diagnosis Date  . Abnormal Pap smear of cervix    2016 ASCUS HPV HR+, 10-30-15 HGSIL, 2018 LGSIL HPV HR+  . Amenorrhea   . Headache(784.0)   . Right distal ulnar fracture 07/20/2015  . Substance abuse (HCC)    previous pain pill addiction    Past Surgical History:  Procedure Laterality Date  . COLPOSCOPY    . GUM SURGERY     age 55  . I & D EXTREMITY Right 07/20/2015   Procedure: IRRIGATION AND DEBRIDEMENT RIGHT ARM;  Surgeon: Mack Hook, MD;  Location: [facility] OR;  Service: Orthopedics;  Laterality: Right;  . LEEP    . ORIF ULNAR FRACTURE Right 07/20/2015   Procedure: OPEN REDUCTION INTERNAL FIXATION (ORIF) RIGHT ULNAR FRACTURE;  Surgeon: Mack Hook, MD;  Location: [facility] OR;  Service: Orthopedics;  Laterality: Right;    Current Outpatient Medications  Medication Sig Dispense Refill  . cetirizine (ZYRTEC) 10 MG tablet Take 10 mg by mouth daily.     No current facility-administered medications for this visit.    Family History  Problem Relation Age of Onset  . Aneurysm Mother   . Diabetes Paternal Grandmother   . Glaucoma Paternal Grandmother     Review of Systems  Exam:   There were no vitals taken for this visit.   Weight change: @WEIGHTCHANGE @ Height:      Ht Readings from Last 3 Encounters:  01/25/20 5\' 3"  (1.6 m)  11/02/19 5\' 6"  (1.676 m)  08/17/19 5\' 3"  (1.6 m)    General appearance: alert, cooperative and appears stated age Head: Normocephalic, without obvious abnormality, atraumatic Neck: no adenopathy, supple, symmetrical, trachea midline and thyroid {CHL AMB PHY EX THYROID NORM DEFAULT:[phone]::"normal to inspection and palpation"} Lungs: clear to auscultation bilaterally Cardiovascular: regular rate and rhythm Breasts: {Exam; breast:13139::"normal appearance, no masses or tenderness"} Abdomen: soft, non-tender; non distended,  no masses,  no organomegaly Extremities: extremities normal, atraumatic, no cyanosis or edema Skin: Skin color, texture, turgor normal. No rashes or lesions Lymph nodes: Cervical, supraclavicular, and axillary nodes normal. No abnormal inguinal nodes palpated Neurologic: Grossly normal   Pelvic: External genitalia:  no lesions              Urethra:  normal appearing urethra with no masses, tenderness or lesions              Bartholins and Skenes: normal                 Vagina: normal appearing vagina with normal color and discharge, no lesions              Cervix: {CHL AMB PHY EX CERVIX NORM DEFAULT:[phone]::"no  lesions"}               Bimanual Exam:  Uterus:  {CHL AMB PHY EX UTERUS NORM DEFAULT:[phone]::"normal size, contour, position, consistency, mobility, non-tender"}              Adnexa: {CHL AMB PHY EX ADNEXA NO MASS DEFAULT:[phone]::"no mass, fullness, tenderness"}               Rectovaginal: Confirms               Anus:  normal sphincter tone, no lesions  *** chaperoned for the exam.  A:  Well Woman with normal exam  P:

## 2020-07-18 ENCOUNTER — Telehealth: Payer: Self-pay | Admitting: *Deleted

## 2020-07-18 NOTE — Telephone Encounter (Signed)
Patient called due to Depo provera injection 5/4/-5/18, last injection given on 04/25/20. Patient found out today she has Covid.  Her last annual exam was in 10/20, she was scheduled for annual exam on 07/19/20, but due to covid canceled. Annual now scheduled on 12/04/20, patient is sexually active. Do you want patient to have annual before she receives her next injection?  Please advise

## 2020-07-18 NOTE — Telephone Encounter (Signed)
She should definitely continue on her depo-provera until her annual exam. f by the time she gets her next injection it has been 15 weeks since her last one, she should have a UPT first and use back up contraception for 1 week.  I would recommend she use condoms starting next week (if not already using them)

## 2020-07-19 ENCOUNTER — Ambulatory Visit: Payer: BC Managed Care – PPO | Admitting: Obstetrics and Gynecology

## 2020-07-19 NOTE — Telephone Encounter (Signed)
Patient informed with below note. I explained if no symptoms in 10 days from the day she was diagnosis with Covid she can still have depo provera. If symptoms she should remain home.  Patient said she will follow up to schedule based on symptoms. I did encourage her to use condoms as well.

## 2020-07-19 NOTE — Telephone Encounter (Signed)
Left message for patient to call.

## 2020-08-07 ENCOUNTER — Ambulatory Visit: Payer: BC Managed Care – PPO

## 2020-08-09 ENCOUNTER — Ambulatory Visit (INDEPENDENT_AMBULATORY_CARE_PROVIDER_SITE_OTHER): Payer: BC Managed Care – PPO

## 2020-08-09 ENCOUNTER — Other Ambulatory Visit: Payer: Self-pay

## 2020-08-09 DIAGNOSIS — Z3042 Encounter for surveillance of injectable contraceptive: Secondary | ICD-10-CM | POA: Diagnosis not present

## 2020-08-09 MED ORDER — MEDROXYPROGESTERONE ACETATE 150 MG/ML IM SUSP
150.0000 mg | Freq: Once | INTRAMUSCULAR | Status: AC
  Administered 2020-08-09: 150 mg via INTRAMUSCULAR

## 2020-08-14 ENCOUNTER — Telehealth: Payer: Self-pay | Admitting: *Deleted

## 2020-08-14 NOTE — Telephone Encounter (Signed)
Patient called and left detailed message on triage voicemail c/o severe pelvic cramping with last depo provera injection on 08/09/20. I called patient back and left detailed message on cell to schedule office visit with provider for exam due to pain.

## 2020-08-15 ENCOUNTER — Encounter: Payer: Self-pay | Admitting: Obstetrics and Gynecology

## 2020-08-15 ENCOUNTER — Ambulatory Visit: Payer: BC Managed Care – PPO | Admitting: Obstetrics and Gynecology

## 2020-08-15 ENCOUNTER — Other Ambulatory Visit: Payer: Self-pay

## 2020-08-15 VITALS — BP 110/64 | HR 67 | Ht 63.0 in | Wt 149.0 lb

## 2020-08-15 DIAGNOSIS — R102 Pelvic and perineal pain: Secondary | ICD-10-CM

## 2020-08-15 DIAGNOSIS — N912 Amenorrhea, unspecified: Secondary | ICD-10-CM | POA: Diagnosis not present

## 2020-08-15 LAB — PREGNANCY, URINE: Preg Test, Ur: NEGATIVE

## 2020-08-15 NOTE — Progress Notes (Signed)
GYNECOLOGY  VISIT   HPI: 30 y.o.   Single White or Caucasian Not Hispanic or Latino  female   G0P0000 with No LMP recorded. Patient has had an injection.   here for pelvic cramping. Patient got her depo injection on Thursday and she experenced bad cramping on Sunday starting around 9:45am lasting up until yesterday.     She got her Depo-provera shot on 08/09/20, she was late for her shot. She was going to get her annual and depo on 07/19/20 but she got covid and had to cancel. Not sexually active since ~07/17/20.   She has been on depo-provera for 7 years. No cycles. She doesn't typically get cramps.  She got her Depo-provera on 08/09/20, menstrual like cramping started on 08/12/20. The cramps were bad for 2 days, up to an 8/10 in severity. Yesterday the cramping was mild. Today is minimal, tender to push on her lower abdomen. No fevers, no bowel or bladder c/o. No worries about STD's  GYNECOLOGIC HISTORY: No LMP recorded. Patient has had an injection. Contraception:Depo Menopausal hormone therapy: none         OB History    Gravida  0   Para  0   Term  0   Preterm  0   AB  0   Living  0     SAB  0   IAB  0   Ectopic  0   Multiple  0   Live Births                 Patient Active Problem List   Diagnosis Date Noted  . Pain and swelling of right wrist 08/08/2015  . Stiffness in joint 08/08/2015  . Right distal ulnar fracture 07/21/2015  . History of chlamydia infection 08/18/2012  . History of substance abuse (HCC) 07/08/2012    Past Medical History:  Diagnosis Date  . Abnormal Pap smear of cervix    2016 ASCUS HPV HR+, 10-30-15 HGSIL, 2018 LGSIL HPV HR+  . Amenorrhea   . Headache(784.0)   . Right distal ulnar fracture 07/20/2015  . Substance abuse (HCC)    previous pain pill addiction    Past Surgical History:  Procedure Laterality Date  . COLPOSCOPY    . GUM SURGERY     age 80  . I & D EXTREMITY Right 07/20/2015   Procedure: IRRIGATION AND DEBRIDEMENT  RIGHT ARM;  Surgeon: Mack Hook, MD;  Location: [facility] OR;  Service: Orthopedics;  Laterality: Right;  . LEEP    . ORIF ULNAR FRACTURE Right 07/20/2015   Procedure: OPEN REDUCTION INTERNAL FIXATION (ORIF) RIGHT ULNAR FRACTURE;  Surgeon: Mack Hook, MD;  Location: [facility] OR;  Service: Orthopedics;  Laterality: Right;    Current Outpatient Medications  Medication Sig Dispense Refill  . cetirizine (ZYRTEC) 10 MG tablet Take 10 mg by mouth daily.     No current facility-administered medications for this visit.     ALLERGIES: Zithromax [azithromycin]  Family History  Problem Relation Age of Onset  . Aneurysm Mother   . Diabetes Paternal Grandmother   . Glaucoma Paternal Grandmother     Social History   Socioeconomic History  . Marital status: Single    Spouse name: Not on file  . Number of children: Not on file  . Years of education: Not on file  . Highest education level: Not on file  Occupational History  . Not on file  Tobacco Use  . Smoking status: Former Smoker    Types:  Cigarettes  . Smokeless tobacco: Never Used  . Tobacco comment: 4  day  Substance and Sexual Activity  . Alcohol use: No  . Drug use: Yes    Types: Marijuana    Comment: every day to sleep  . Sexual activity: Yes    Partners: Male    Birth control/protection: Injection    Comment: depo injection  Other Topics Concern  . Not on file  Social History Narrative  . Not on file   Social Determinants of Health   Financial Resource Strain: Not on file  Food Insecurity: Not on file  Transportation Needs: Not on file  Physical Activity: Not on file  Stress: Not on file  Social Connections: Not on file  Intimate Partner Violence: Not on file    Review of Systems  Gastrointestinal: Positive for abdominal pain.  All other systems reviewed and are negative.   PHYSICAL EXAMINATION:    BP 110/64   Pulse 67   Ht 5\' 3"  (1.6 m)   Wt 149 lb (67.6 kg)   SpO2 100%   BMI 26.39 kg/m     General  appearance: alert, cooperative and appears stated age Abdomen: soft, mildly tender BLQ, no rebound, no guarding, non distended, no masses,  no organomegaly  Pelvic: External genitalia:  no lesions              Urethra:  normal appearing urethra with no masses, tenderness or lesions              Bartholins and Skenes: normal                 Vagina: normal appearing vagina with normal color and discharge, no lesions              Cervix: no cervical motion tenderness, no lesions and short              Bimanual Exam:  Uterus:  small, anteverted, mobile, mildly tender              Adnexa: no mass, fullness, tenderness               Chaperone was present for exam.  1. Pelvic cramping Improving, exam is not concerning She was late for her depo-provera - Pregnancy, urine: negative -Patient reassured  2. Amenorrhea On long term depo-provera, last shot was late. - Pregnancy, urine: negative

## 2020-10-31 ENCOUNTER — Ambulatory Visit: Payer: BC Managed Care – PPO

## 2020-11-01 ENCOUNTER — Ambulatory Visit (INDEPENDENT_AMBULATORY_CARE_PROVIDER_SITE_OTHER): Payer: BC Managed Care – PPO

## 2020-11-01 ENCOUNTER — Other Ambulatory Visit: Payer: Self-pay

## 2020-11-01 DIAGNOSIS — Z3042 Encounter for surveillance of injectable contraceptive: Secondary | ICD-10-CM | POA: Diagnosis not present

## 2020-11-01 MED ORDER — MEDROXYPROGESTERONE ACETATE 150 MG/ML IM SUSP
150.0000 mg | Freq: Once | INTRAMUSCULAR | Status: AC
Start: 2020-11-01 — End: 2020-11-01
  Administered 2020-11-01: 150 mg via INTRAMUSCULAR

## 2020-11-01 NOTE — Progress Notes (Signed)
Patient is here for Depo Provera Injection Patient is within Depo Provera Calender Limits yes last given on 08/09/20  Next Depo Due between: 11/10-11/24 Last AEX: 12/13/18 with JJ  AEX Scheduled: 12/04/20  Patient is aware when next depo is due  Pt tolerated Injection well in LUOQ  Routed to provider for review, encounter closed.

## 2020-11-01 NOTE — Addendum Note (Signed)
Addended by: Blima Ledger on: 11/01/2020 03:42 PM   Modules accepted: Orders

## 2020-12-04 ENCOUNTER — Ambulatory Visit: Payer: BC Managed Care – PPO | Admitting: Obstetrics and Gynecology

## 2021-01-01 ENCOUNTER — Other Ambulatory Visit: Payer: Self-pay | Admitting: Urology

## 2021-01-01 DIAGNOSIS — R3129 Other microscopic hematuria: Secondary | ICD-10-CM

## 2021-01-17 ENCOUNTER — Ambulatory Visit
Admission: RE | Admit: 2021-01-17 | Discharge: 2021-01-17 | Disposition: A | Discharge status: Home / Self Care | Payer: BC Managed Care – PPO | Source: Ambulatory Visit | Attending: Urology | Admitting: Urology

## 2021-01-17 DIAGNOSIS — R3129 Other microscopic hematuria: Secondary | ICD-10-CM

## 2022-07-13 IMAGING — US US RENAL
1 series · 14 of 25 positions shown · non-contrast
Comparison: None.

CLINICAL DATA: Microscopic hematuria

EXAM:
RENAL / URINARY TRACT ULTRASOUND COMPLETE

[Series 1: us renal · 0.21mm/px · 14 of 39 slices shown]
[im 1/39]
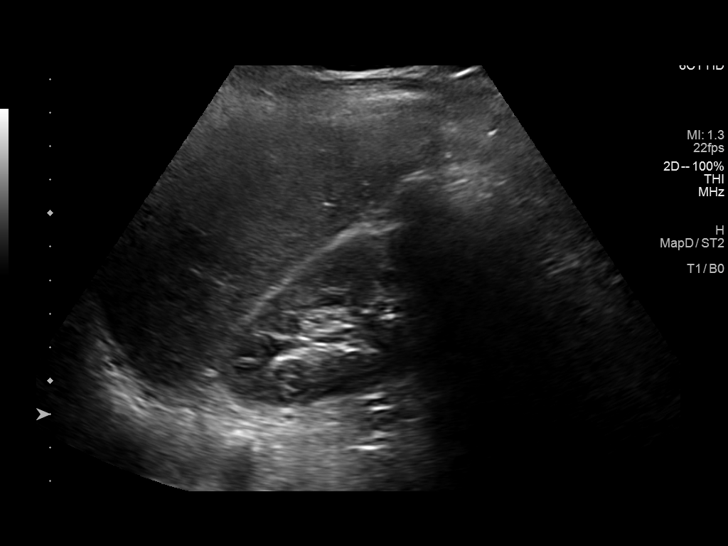
[im 4/39]
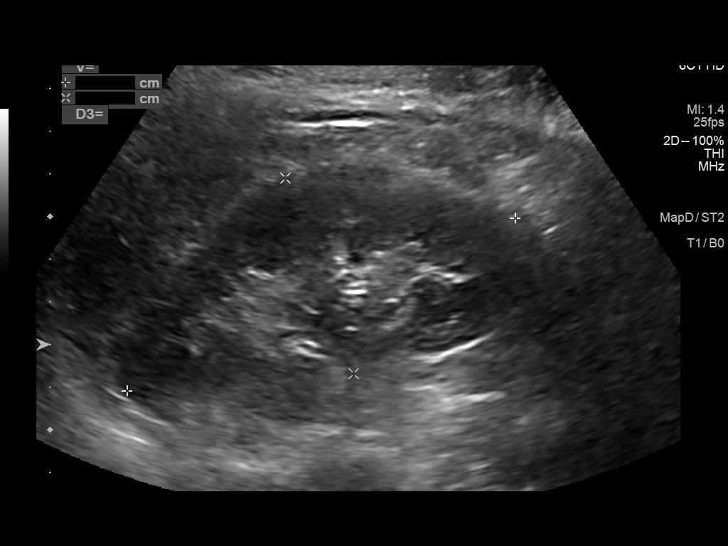
[im 7/39]
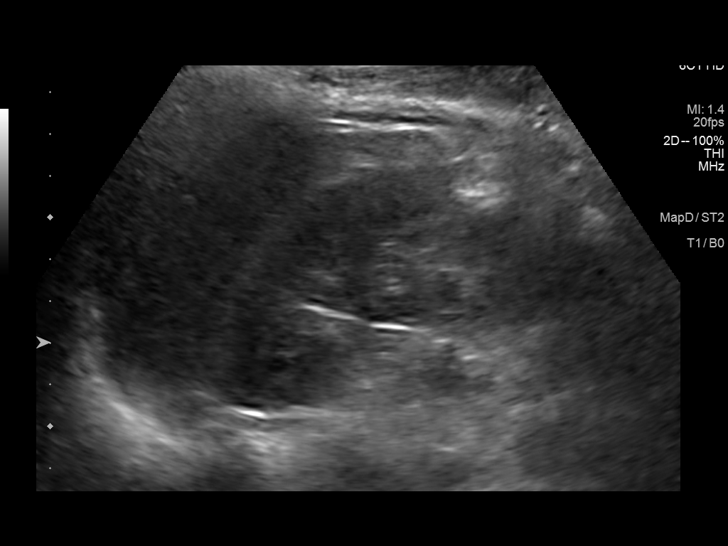
[im 10/39]
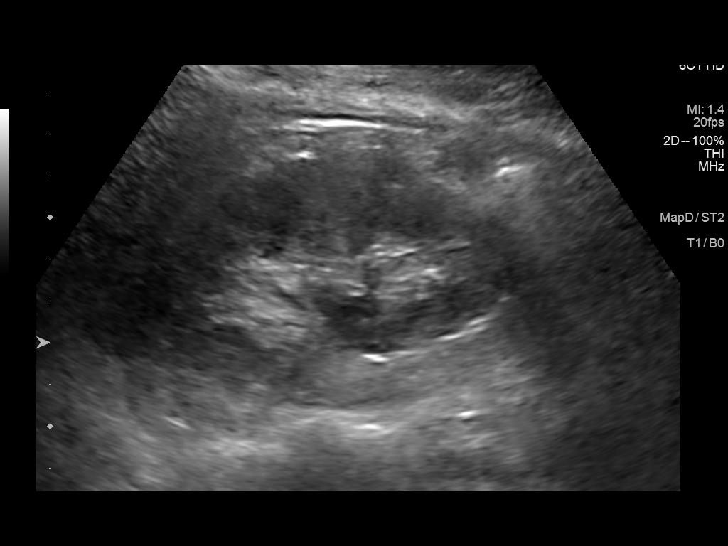
[im 13/39]
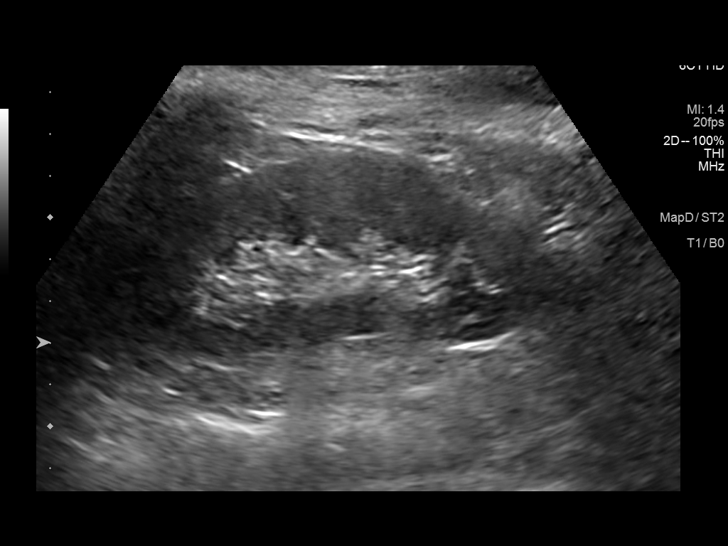
[im 15/39]
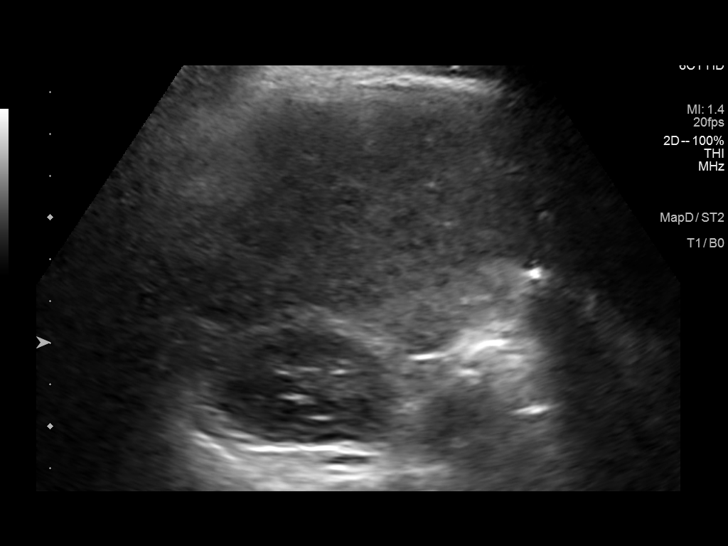
[im 18/39]
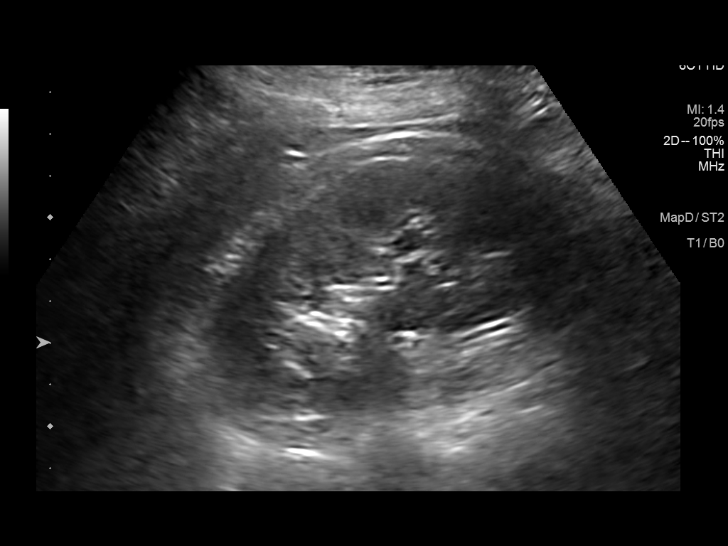
[im 21/39]
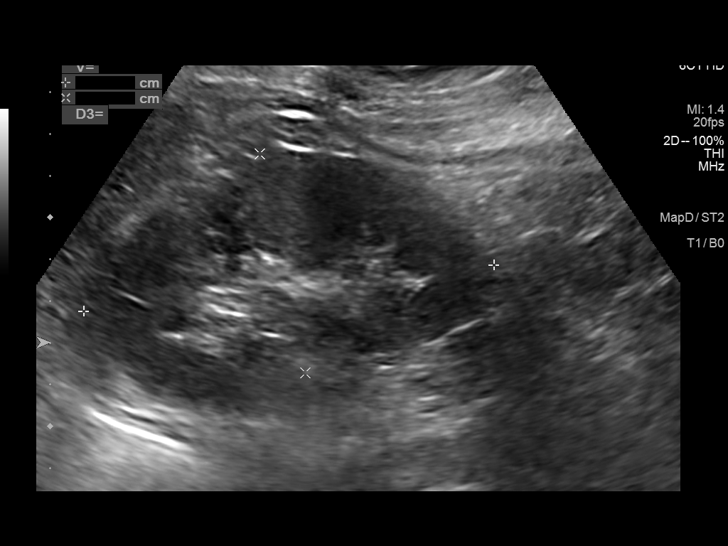
[im 24/39]
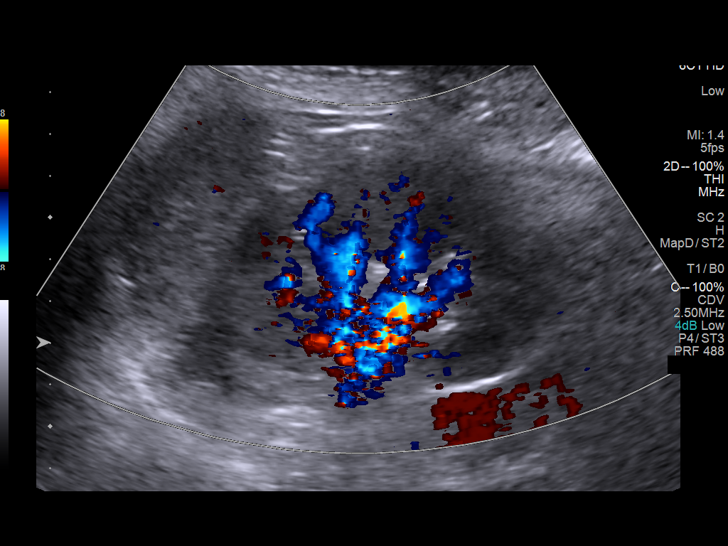
[im 26/39]
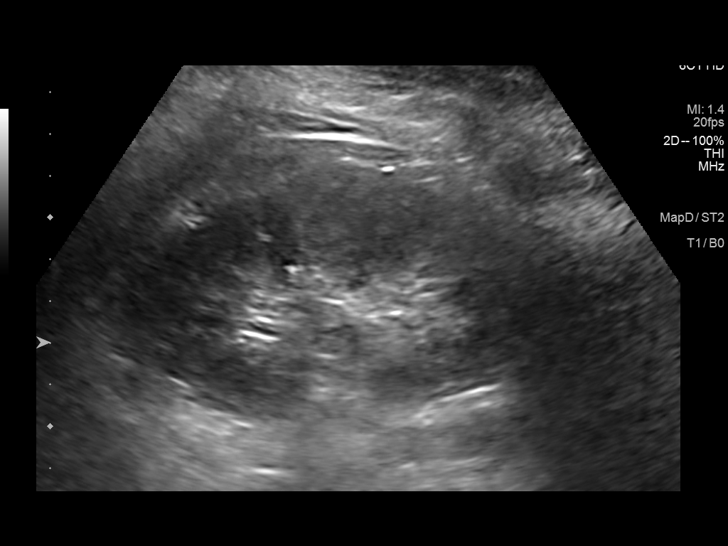
[im 29/39]
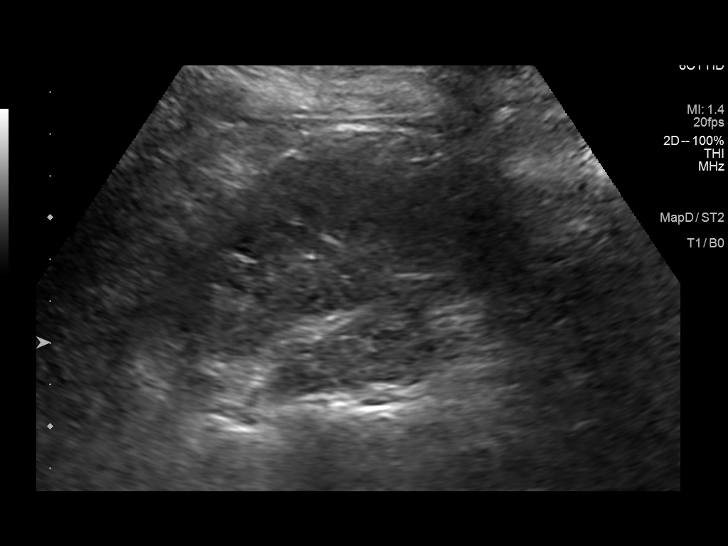
[im 32/39]
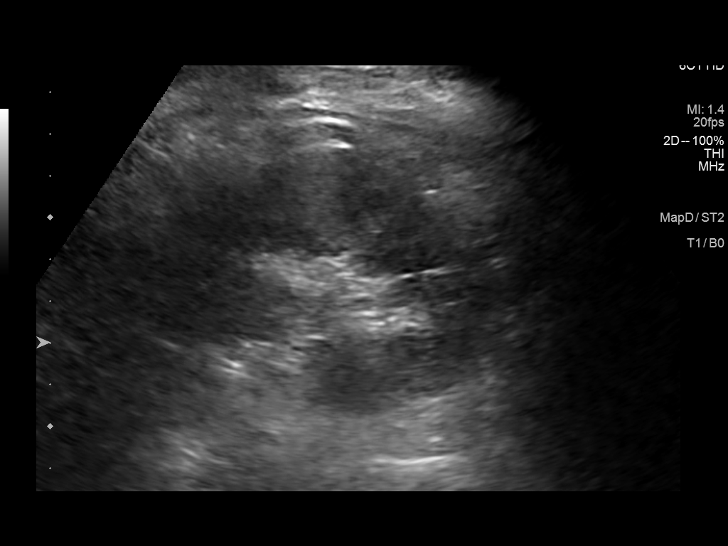
[im 35/39]
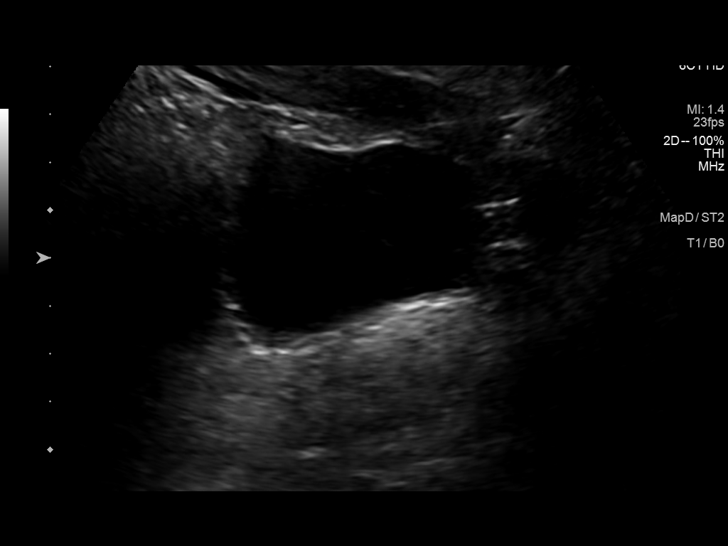
[im 39/39]
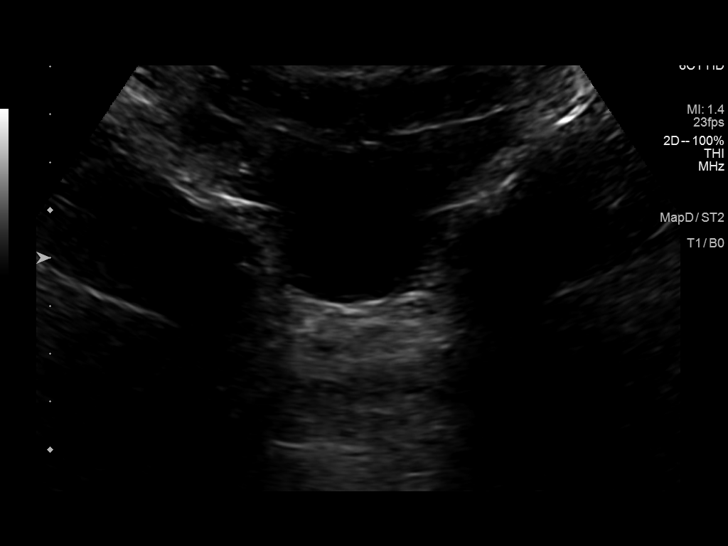

[14 of 25 positions shown; findings below may reference images not displayed]

FINDINGS: Right Kidney:

Renal measurements: 10 x 4.9 x 4.3 cm = volume: 108 mL. Echogenicity
within normal limits. No mass or hydronephrosis visualized.

Left Kidney:

Renal measurements: 9.9 x 5.4 x 4.3 cm = volume: 118 mL.
Echogenicity within normal limits. No mass or hydronephrosis
visualized.

Bladder:

Appears normal for degree of bladder distention.

Other:

None.
IMPRESSION: Negative renal ultrasound
